# Patient Record
Sex: Male | Born: 1950 | Race: Black or African American | Hispanic: No | Marital: Married | Smoking: Former smoker
Health system: Southern US, Community
[De-identification: ages and names within clinical notes are randomized; demographics above are authoritative.]

## PROBLEM LIST (undated history)

## (undated) DIAGNOSIS — Z9289 Personal history of other medical treatment: Secondary | ICD-10-CM

## (undated) DIAGNOSIS — I10 Essential (primary) hypertension: Secondary | ICD-10-CM

## (undated) DIAGNOSIS — F1010 Alcohol abuse, uncomplicated: Secondary | ICD-10-CM

## (undated) DIAGNOSIS — G4733 Obstructive sleep apnea (adult) (pediatric): Secondary | ICD-10-CM

## (undated) DIAGNOSIS — E785 Hyperlipidemia, unspecified: Secondary | ICD-10-CM

## (undated) DIAGNOSIS — I4892 Unspecified atrial flutter: Secondary | ICD-10-CM

## (undated) HISTORY — DX: Unspecified atrial flutter: I48.92

## (undated) HISTORY — PX: HERNIA REPAIR: SHX51

## (undated) HISTORY — DX: Alcohol abuse, uncomplicated: F10.10

## (undated) HISTORY — DX: Obstructive sleep apnea (adult) (pediatric): G47.33

## (undated) HISTORY — DX: Personal history of other medical treatment: Z92.89

---

## 2007-09-24 ENCOUNTER — Other Ambulatory Visit: Payer: Self-pay

## 2007-09-24 ENCOUNTER — Ambulatory Visit: Payer: Self-pay | Admitting: General Surgery

## 2007-10-01 ENCOUNTER — Other Ambulatory Visit: Payer: Self-pay

## 2007-10-01 ENCOUNTER — Ambulatory Visit: Payer: Self-pay | Admitting: General Surgery

## 2011-10-11 ENCOUNTER — Emergency Department: Payer: Self-pay | Admitting: *Deleted

## 2011-10-11 LAB — COMPREHENSIVE METABOLIC PANEL
Alkaline Phosphatase: 65 U/L (ref 50–136)
Anion Gap: 10 (ref 7–16)
BUN: 11 mg/dL (ref 7–18)
Bilirubin,Total: 0.2 mg/dL (ref 0.2–1.0)
Calcium, Total: 8.7 mg/dL (ref 8.5–10.1)
Co2: 25 mmol/L (ref 21–32)
EGFR (African American): >60
EGFR (Non-African Amer.): >60
Potassium: 3.8 mmol/L (ref 3.5–5.1)
SGOT(AST): 23 U/L (ref 15–37)

## 2011-10-11 LAB — CBC
HGB: 14.1 g/dL (ref 13.0–18.0)
MCH: 27.2 pg (ref 26.0–34.0)
MCV: 82 fL (ref 80–100)
WBC: 11.1 10*3/uL — ABNORMAL HIGH (ref 3.8–10.6)

## 2017-11-24 DIAGNOSIS — I10 Essential (primary) hypertension: Secondary | ICD-10-CM | POA: Diagnosis not present

## 2017-11-24 DIAGNOSIS — G4733 Obstructive sleep apnea (adult) (pediatric): Secondary | ICD-10-CM | POA: Diagnosis not present

## 2017-11-24 DIAGNOSIS — E6609 Other obesity due to excess calories: Secondary | ICD-10-CM | POA: Diagnosis not present

## 2017-11-24 DIAGNOSIS — E781 Pure hyperglyceridemia: Secondary | ICD-10-CM | POA: Diagnosis not present

## 2017-11-24 DIAGNOSIS — N528 Other male erectile dysfunction: Secondary | ICD-10-CM | POA: Diagnosis not present

## 2017-11-24 DIAGNOSIS — R7303 Prediabetes: Secondary | ICD-10-CM | POA: Diagnosis not present

## 2017-12-01 DIAGNOSIS — E781 Pure hyperglyceridemia: Secondary | ICD-10-CM | POA: Diagnosis not present

## 2017-12-01 DIAGNOSIS — R7303 Prediabetes: Secondary | ICD-10-CM | POA: Diagnosis not present

## 2017-12-01 DIAGNOSIS — I10 Essential (primary) hypertension: Secondary | ICD-10-CM | POA: Diagnosis not present

## 2017-12-08 ENCOUNTER — Emergency Department
Admission: EM | Admit: 2017-12-08 | Discharge: 2017-12-08 | Disposition: A | Discharge status: Home / Self Care | Payer: Self-pay | Attending: Emergency Medicine | Admitting: Emergency Medicine

## 2017-12-08 ENCOUNTER — Encounter: Payer: Self-pay | Admitting: Emergency Medicine

## 2017-12-08 ENCOUNTER — Emergency Department: Payer: Self-pay

## 2017-12-08 DIAGNOSIS — Z79899 Other long term (current) drug therapy: Secondary | ICD-10-CM | POA: Insufficient documentation

## 2017-12-08 DIAGNOSIS — Y999 Unspecified external cause status: Secondary | ICD-10-CM | POA: Insufficient documentation

## 2017-12-08 DIAGNOSIS — W231XXA Caught, crushed, jammed, or pinched between stationary objects, initial encounter: Secondary | ICD-10-CM | POA: Insufficient documentation

## 2017-12-08 DIAGNOSIS — I10 Essential (primary) hypertension: Secondary | ICD-10-CM | POA: Insufficient documentation

## 2017-12-08 DIAGNOSIS — T148XXA Other injury of unspecified body region, initial encounter: Secondary | ICD-10-CM

## 2017-12-08 DIAGNOSIS — S61012A Laceration without foreign body of left thumb without damage to nail, initial encounter: Secondary | ICD-10-CM | POA: Diagnosis not present

## 2017-12-08 DIAGNOSIS — S6702XA Crushing injury of left thumb, initial encounter: Secondary | ICD-10-CM | POA: Diagnosis not present

## 2017-12-08 DIAGNOSIS — S61112A Laceration without foreign body of left thumb with damage to nail, initial encounter: Secondary | ICD-10-CM | POA: Diagnosis not present

## 2017-12-08 DIAGNOSIS — Y929 Unspecified place or not applicable: Secondary | ICD-10-CM | POA: Insufficient documentation

## 2017-12-08 DIAGNOSIS — Y9389 Activity, other specified: Secondary | ICD-10-CM | POA: Insufficient documentation

## 2017-12-08 HISTORY — DX: Essential (primary) hypertension: I10

## 2017-12-08 MED ORDER — LIDOCAINE HCL (PF) 1 % IJ SOLN
5.0000 mL | Freq: Once | INTRAMUSCULAR | Status: AC
  Administered 2017-12-08: 5 mL via INTRADERMAL
  Filled 2017-12-08: qty 5

## 2017-12-08 MED ORDER — TRAMADOL HCL 50 MG PO TABS: 50.0000 mg | ORAL_TABLET | Freq: Four times a day (QID) | ORAL | 0 refills | Status: DC | PRN

## 2017-12-08 MED ORDER — CEPHALEXIN 500 MG PO CAPS: 500.0000 mg | ORAL_CAPSULE | Freq: Three times a day (TID) | ORAL | 0 refills | Status: DC

## 2017-12-08 MED ORDER — BACITRACIN ZINC 500 UNIT/GM EX OINT
1.0000 "application " | TOPICAL_OINTMENT | Freq: Once | CUTANEOUS | Status: DC
  Filled 2017-12-08: qty 0.9

## 2017-12-08 NOTE — Discharge Instructions (Addendum)
Follow-up with your regular doctor or have the nurse at [facility] [employer] fabrics remove the stitches.  Stitches should remain in place for 7 to 10 days.  You been given an antibiotic to prevent infection as this was a very open wound.  You do not have a broken bone underneath.  However if you lose any range of motion of your left hand and thumb but you will need to see orthopedic doctor.  You should remain on light duty for 5 to 7 days.  No use of the left hand.

## 2017-12-08 NOTE — ED Notes (Signed)
This tech wrapped pt left thumb with roll gauze and tape with a bulky wrap

## 2017-12-08 NOTE — ED Provider Notes (Signed)
[facility] Emergency Department Provider Note  ____________________________________________   First MD Initiated Contact with Patient 12/08/17 [phone]     (approximate)  I have reviewed the triage vital signs and the nursing notes.   HISTORY  Chief Complaint Laceration and Finger Injury    HPI Peter Hartman is a 67 y.o. male since emergency department complaining of laceration to the left thumb.  He states he was working on a car and the jack fell crushing his left thumb.  He states his last tetanus was less than 5 years ago.  He states he does not think he hit the bone just hit the skin.  He denies any numbness or tingling at this time.  Past Medical History:  Diagnosis Date  . Hypertension     There are no active problems to display for this patient.   History reviewed. No pertinent surgical history.  Prior to Admission medications   Medication Sig Start Date End Date Taking? Authorizing Provider  hydrochlorothiazide (HYDRODIURIL) 25 MG tablet Take 25 mg by mouth daily.   Yes [provider]  lisinopril (PRINIVIL,ZESTRIL) 20 MG tablet Take 20 mg by mouth daily.   Yes [provider]  cephALEXin (KEFLEX) 500 MG capsule Take 1 capsule (500 mg total) by mouth 3 (three) times daily. 12/08/17   Hisae Decoursey, Roselyn Bering, PA-C  traMADol (ULTRAM) 50 MG tablet Take 1 tablet (50 mg total) by mouth every 6 (six) hours as needed. 12/08/17   Faythe Ghee, PA-C    Allergies Patient has no allergy information on record.  No family history on file.  Social History Social History   Tobacco Use  . Smoking status: Not on file  Substance Use Topics  . Alcohol use: Not on file  . Drug use: Not on file    Review of Systems  Constitutional: No fever/chills Eyes: No visual changes. ENT: No sore throat. Respiratory: Denies cough Genitourinary: Negative for dysuria. Musculoskeletal: Negative for back pain.  Positive for thumb injury Skin:  Negative for rash.  Positive for laceration left thumb    ____________________________________________   PHYSICAL EXAM:  VITAL SIGNS: ED Triage Vitals  Enc Vitals Group     BP 12/08/17 0824 (!) 168/85     Pulse Rate 12/08/17 0824 82     Resp 12/08/17 0824 15     Temp 12/08/17 0824 98.6 F (37 C)     Temp Source 12/08/17 0824 Oral     SpO2 12/08/17 0824 99 %     Weight 12/08/17 0813 243 lb (110.2 kg)     Height 12/08/17 0813 5\' 9"  (1.753 m)     Head Circumference --      Peak Flow --      Pain Score 12/08/17 0813 6     Pain Loc --      Pain Edu? --      Excl. in GC? --     Constitutional: Alert and oriented. Well appearing and in no acute distress. Eyes: Conjunctivae are normal.  Head: Atraumatic. Nose: No congestion/rhinnorhea. Mouth/Throat: Mucous membranes are moist.   Cardiovascular: Normal rate, regular rhythm. Respiratory: Normal respiratory effort.  No retractions GU: deferred Musculoskeletal: FROM all extremities, warm and well perfused.  There is a large laceration along the outside of the left thumb.  It extends from the base of the thumb to the tip of the finger.  There is a small star-shaped laceration on the pad of the thumb.  He has full  range of motion and is neurovascularly intact Neurologic:  Normal speech and language.  Skin:  Skin is warm, dry.  Positive laceration to left thumb.  No rash noted. Psychiatric: Mood and affect are normal. Speech and behavior are normal.  ____________________________________________   LABS (all labs ordered are listed, but only abnormal results are displayed)  Labs Reviewed - No data to display ____________________________________________   ____________________________________________  RADIOLOGY  X-ray of the left thumb is negative for fracture  ____________________________________________   PROCEDURES  Procedure(s) performed:   Marland KitchenMarland KitchenLaceration Repair Date/Time: 12/08/2017 2:52 PM Performed by: Faythe Ghee, PA-C Authorized by: Faythe Ghee, PA-C   Consent:    Consent obtained:  Verbal   Consent given by:  Patient   Risks discussed:  Pain, poor cosmetic result, poor wound healing, infection and tendon damage   Alternatives discussed:  Delayed treatment Anesthesia (see MAR for exact dosages):    Anesthesia method:  Local infiltration   Local anesthetic:  Lidocaine 1% w/o epi Laceration details:    Location:  Finger   Finger location:  L thumb   Length (cm):  3.5   Depth (mm):  5 Repair type:    Repair type:  Simple Pre-procedure details:    Preparation:  Patient was prepped and draped in usual sterile fashion Exploration:    Hemostasis achieved with:  Direct pressure   Wound exploration: wound explored through full range of motion     Wound extent: fascia violated     Wound extent: no foreign bodies/material noted, no muscle damage noted, no nerve damage noted, no tendon damage noted, no underlying fracture noted and no vascular damage noted     Contaminated: no   Treatment:    Area cleansed with:  Betadine and saline   Amount of cleaning:  Extensive   Irrigation solution:  Sterile saline   Irrigation method:  Pressure wash, syringe and tap   Visualized foreign bodies/material removed: no   Skin repair:    Repair method:  Sutures   Suture size:  5-0   Suture material:  Nylon   Suture technique:  Simple interrupted   Number of sutures:  14 Approximation:    Approximation:  Close Post-procedure details:    Dressing:  Antibiotic ointment and non-adherent dressing   Patient tolerance of procedure:  Tolerated well, no immediate complications      ____________________________________________   INITIAL IMPRESSION / ASSESSMENT AND PLAN / ED COURSE  Pertinent labs & imaging results that were available during my care of the patient were reviewed by me and considered in my medical decision making (see chart for details).  Patient is a 67 year old male presents emergency  department with a large laceration to the left thumb.  His crush injury where the jack fell and landed on his thumb.  On physical exam the thumb has a laceration from the base of the thumb to the tip of the thumb on the side, a star-shaped lack on the pad of the thumb.  The bone is mildly tender.  He is neurovascularly intact.  No tendon involvement is noted  X-ray left thumb is negative  The laceration was repaired with 5-0 Ethilon, 14 sutures were placed.  The patient was given a prescription for Keflex 500 mg 3 times a day for 7 days.  He is to have the sutures removed in 7 to 10 days.  He was given a work note for light duty for the next 5 to 7 days.  He should  not use his left hand while at work.  He states he understands and will comply with our instructions.  He was discharged in stable condition     As part of my medical decision making, I reviewed the following data within the electronic MEDICAL RECORD NUMBER Nursing notes reviewed and incorporated, Old chart reviewed, Radiograph reviewed x-ray left thumb is negative, Notes from prior ED visits and [place] Controlled Substance Database  ____________________________________________   FINAL CLINICAL IMPRESSION(S) / ED DIAGNOSES  Final diagnoses:  Laceration of left thumb without foreign body without damage to nail, initial encounter  Crush injury      NEW MEDICATIONS STARTED DURING THIS VISIT:  Discharge Medication List as of 12/08/2017 10:55 AM    START taking these medications   Details  cephALEXin (KEFLEX) 500 MG capsule Take 1 capsule (500 mg total) by mouth 3 (three) times daily., Starting Tue 12/08/2017, Print    traMADol (ULTRAM) 50 MG tablet Take 1 tablet (50 mg total) by mouth every 6 (six) hours as needed., Starting Tue 12/08/2017, Print         Note:  This document was prepared using Dragon voice recognition software and may include unintentional dictation errors.    Faythe Ghee, PA-C 12/08/17 1457    Nita Sickle, MD 12/09/17 1255

## 2017-12-08 NOTE — ED Notes (Signed)
See triage note  States a jack slipped   Hit the edge of left thumb  Laceration to lateral aspect of thumb

## 2017-12-08 NOTE — ED Triage Notes (Signed)
Pt reports was messing with a jack and it fell on his left hand thumb. Pt with laceration to thumb. Area wrapped in bandage.

## 2018-03-22 DIAGNOSIS — G4733 Obstructive sleep apnea (adult) (pediatric): Secondary | ICD-10-CM | POA: Diagnosis not present

## 2018-03-22 DIAGNOSIS — I10 Essential (primary) hypertension: Secondary | ICD-10-CM | POA: Diagnosis not present

## 2018-03-22 DIAGNOSIS — R7303 Prediabetes: Secondary | ICD-10-CM | POA: Diagnosis not present

## 2018-03-22 DIAGNOSIS — E781 Pure hyperglyceridemia: Secondary | ICD-10-CM | POA: Diagnosis not present

## 2018-07-23 ENCOUNTER — Ambulatory Visit: Payer: PRIVATE HEALTH INSURANCE | Attending: Neurology

## 2018-07-23 DIAGNOSIS — G4733 Obstructive sleep apnea (adult) (pediatric): Secondary | ICD-10-CM | POA: Diagnosis not present

## 2018-07-28 ENCOUNTER — Ambulatory Visit (INDEPENDENT_AMBULATORY_CARE_PROVIDER_SITE_OTHER): Payer: No Typology Code available for payment source | Admitting: Urology

## 2018-07-28 ENCOUNTER — Encounter: Payer: Self-pay | Admitting: Urology

## 2018-07-28 VITALS — BP 155/91 | HR 91 | Ht 69.0 in | Wt 248.9 lb

## 2018-07-28 DIAGNOSIS — N5201 Erectile dysfunction due to arterial insufficiency: Secondary | ICD-10-CM

## 2018-07-28 MED ORDER — SILDENAFIL CITRATE 50 MG PO TABS: 50.0000 mg | ORAL_TABLET | Freq: Every day | ORAL | 6 refills | Status: DC | PRN

## 2018-07-28 NOTE — Progress Notes (Signed)
07/28/2018 10:31 AM   Peter Hartman 1950-11-23 [id number]  Referring provider: Marisue Ivan, MD [phone] [facility] [address] [place] [facility] [place], [place] [address]  Chief Complaint  Patient presents with  . Erectile Dysfunction    HPI:  New patient seen today for erectile dysfunction. He has trouble getting and keeping an erection. It's been an issue for 15 yrs and worse over past 3-4 months. Erections straight. He remarried last year. He hasn't tried anything recently. He has a good libido. He has three grown boys.   He had a PSA in 2018 of 0.87.  His erectile dysfunction is associated with obesity, hypertension, hyperlipidemia and diabetes mellitus.  Modifying factors: There are no other modifying factors  Associated signs and symptoms: There are no other associated signs and symptoms Aggravating and relieving factors: There are no other aggravating or relieving factors Severity: Moderate Duration: Persistent   PMH: Past Medical History:  Diagnosis Date  . Hypertension     Surgical History: Past Surgical History:  Procedure Laterality Date  . HERNIA REPAIR      Home Medications:  Allergies as of 07/28/2018   No Known Allergies     Medication List       Accurate as of July 28, 2018 10:31 AM. Always use your most recent med list.        fenofibrate micronized 200 MG capsule Commonly known as:  LOFIBRA Take 200 mg by mouth daily.   hydrochlorothiazide 25 MG tablet Commonly known as:  HYDRODIURIL Take 25 mg by mouth daily.   lisinopril 20 MG tablet Commonly known as:  PRINIVIL,ZESTRIL Take 20 mg by mouth daily.       Allergies: No Known Allergies  Family History: Family History  Problem Relation Age of Onset  . Prostate cancer Neg Hx   . Bladder Cancer Neg Hx   . Kidney cancer Neg Hx     Social History:  reports that he quit smoking about 2 years ago. His smoking use included cigarettes. He has never used smokeless tobacco. He  reports current alcohol use. He reports that he does not use drugs.  ROS: UROLOGY Frequent Urination?: No Hard to postpone urination?: No Burning/pain with urination?: No Get up at night to urinate?: No Leakage of urine?: No Urine stream starts and stops?: No Trouble starting stream?: No Do you have to strain to urinate?: No Blood in urine?: No Urinary tract infection?: No Sexually transmitted disease?: No Injury to kidneys or bladder?: No Painful intercourse?: No Weak stream?: No Erection problems?: Yes Penile pain?: No  Gastrointestinal Nausea?: No Vomiting?: No Indigestion/heartburn?: No Diarrhea?: No Constipation?: No  Constitutional Fever: No Night sweats?: No Weight loss?: No Fatigue?: No  Skin Skin rash/lesions?: No Itching?: No  Eyes Blurred vision?: No Double vision?: No  Ears/Nose/Throat Sore throat?: No Sinus problems?: No  Hematologic/Lymphatic Swollen glands?: No Easy bruising?: No  Cardiovascular Leg swelling?: No Chest pain?: No  Respiratory Cough?: Yes Shortness of breath?: Yes  Endocrine Excessive thirst?: No  Musculoskeletal Back pain?: No Joint pain?: No  Neurological Headaches?: No Dizziness?: No  Psychologic Depression?: No Anxiety?: Yes  Physical Exam: BP (!) 155/91 (BP Location: Left Arm, Patient Position: Sitting, Cuff Size: Normal)   Pulse 91   Ht 5\' 9"  (1.753 m)   Wt 112.9 kg   BMI 36.76 kg/m   Constitutional:  Alert and oriented, No acute distress. HEENT: [employer] AT, moist mucus membranes.  Trachea midline, no masses. Cardiovascular: No clubbing, cyanosis, or edema. Respiratory:  Normal respiratory effort, no increased work of breathing. GI: Abdomen is soft, nontender, nondistended, no abdominal masses GU: No CVA tenderness; penis uncircumcised, no phimosis. Foreskin and penis normal.  DRE: 30 g, smooth, no hard area or nodule on prostate  Lymph: No cervical or inguinal lymphadenopathy. Skin: No rashes,  bruises or suspicious lesions. Neurologic: Grossly intact, no focal deficits, moving all 4 extremities. Psychiatric: Normal mood and affect.  Laboratory Data: Lab Results  Component Value Date   WBC 11.1 (H) 10/11/2011   HGB 14.1 10/11/2011   HCT 42.4 10/11/2011   MCV 82 10/11/2011   PLT 252 10/11/2011    Lab Results  Component Value Date   CREATININE 1.07 10/11/2011    No results found for: PSA  No results found for: TESTOSTERONE  No results found for: HGBA1C  Urinalysis No results found for: COLORURINE, APPEARANCEUR, LABSPEC, PHURINE, GLUCOSEU, HGBUR, BILIRUBINUR, KETONESUR, PROTEINUR, UROBILINOGEN, NITRITE, LEUKOCYTESUR  No results found for: LABMICR, WBCUA, RBCUA, LABEPIT, MUCUS, BACTERIA   No results found for this or any previous visit. No results found for this or any previous visit. No results found for this or any previous visit. No results found for this or any previous visit. No results found for this or any previous visit. No results found for this or any previous visit. No results found for this or any previous visit. No results found for this or any previous visit.  Assessment & Plan:     ED -  We discussed the nature r/b/a to pde5i. I sent a rx for sildenafil 50 mg. Also discussed injections. I recommended a PSA, but he said he gets yearly labs soon with Dr. Burnadette Pop and they will check it.   No follow-ups on file.  Jerilee Field, MD  [facility] [address], [address] [place], [place] [address] [phone]

## 2018-07-28 NOTE — Patient Instructions (Signed)
Sildenafil tablets (Erectile Dysfunction)  What is this medicine?  SILDENAFIL (sil DEN a fil) is used to treat erection problems in men.  This medicine may be used for other purposes; ask your health care provider or pharmacist if you have questions.  COMMON BRAND NAME(S): Viagra  What should I tell my health care provider before I take this medicine?  They need to know if you have any of these conditions:  -bleeding disorders  -eye or vision problems, including a rare inherited eye disease called retinitis pigmentosa  -anatomical deformation of the penis, Peyronie's disease, or history of priapism (painful and prolonged erection)  -heart disease, angina, a history of heart attack, irregular heart beats, or other heart problems  -high or low blood pressure  -history of blood diseases, like sickle cell anemia or leukemia  -history of stomach bleeding  -kidney disease  -liver disease  -stroke  -an unusual or allergic reaction to sildenafil, other medicines, foods, dyes, or preservatives  -pregnant or trying to get pregnant  -breast-feeding  How should I use this medicine?  Take this medicine by mouth with a glass of water. Follow the directions on the prescription label. The dose is usually taken 1 hour before sexual activity. You should not take the dose more than once per day. Do not take your medicine more often than directed.  Talk to your pediatrician regarding the use of this medicine in children. This medicine is not used in children for this condition.  Overdosage: If you think you have taken too much of this medicine contact a poison control center or emergency room at once.  NOTE: This medicine is only for you. Do not share this medicine with others.  What if I miss a dose?  This does not apply. Do not take double or extra doses.  What may interact with this medicine?  Do not take this medicine with any of the following medications:  -cisapride  -nitrates like amyl nitrite, isosorbide dinitrate, isosorbide  mononitrate, nitroglycerin  -riociguat  This medicine may also interact with the following medications:  -antiviral medicines for HIV or AIDS  -bosentan  -certain medicines for benign prostatic hyperplasia (BPH)  -certain medicines for blood pressure  -certain medicines for fungal infections like ketoconazole and itraconazole  -cimetidine  -erythromycin  -rifampin  This list may not describe all possible interactions. Give your health care provider a list of all the medicines, herbs, non-prescription drugs, or dietary supplements you use. Also tell them if you smoke, drink alcohol, or use illegal drugs. Some items may interact with your medicine.  What should I watch for while using this medicine?  If you notice any changes in your vision while taking this drug, call your doctor or health care professional as soon as possible. Stop using this medicine and call your health care provider right away if you have a loss of sight in one or both eyes.  Contact your doctor or health care professional right away if you have an erection that lasts longer than 4 hours or if it becomes painful. This may be a sign of a serious problem and must be treated right away to prevent permanent damage.  If you experience symptoms of nausea, dizziness, chest pain or arm pain upon initiation of sexual activity after taking this medicine, you should refrain from further activity and call your doctor or health care professional as soon as possible.  Do not drink alcohol to excess (examples, 5 glasses of wine or 5 shots of   whiskey) when taking this medicine. When taken in excess, alcohol can increase your chances of getting a headache or getting dizzy, increasing your heart rate or lowering your blood pressure.  Using this medicine does not protect you or your partner against HIV infection (the virus that causes AIDS) or other sexually transmitted diseases.  What side effects may I notice from receiving this medicine?  Side effects that you  should report to your doctor or health care professional as soon as possible:  -allergic reactions like skin rash, itching or hives, swelling of the face, lips, or tongue  -breathing problems  -changes in hearing  -changes in vision  -chest pain  -fast, irregular heartbeat  -prolonged or painful erection  -seizures  Side effects that usually do not require medical attention (report to your doctor or health care professional if they continue or are bothersome):  -back pain  -dizziness  -flushing  -headache  -indigestion  -muscle aches  -nausea  -stuffy or runny nose  This list may not describe all possible side effects. Call your doctor for medical advice about side effects. You may report side effects to FDA at 1-800-FDA-1088.  Where should I keep my medicine?  Keep out of reach of children.  Store at room temperature between 15 and 30 degrees C (59 and 86 degrees F). Throw away any unused medicine after the expiration date.  NOTE: This sheet is a summary. It may not cover all possible information. If you have questions about this medicine, talk to your doctor, pharmacist, or health care provider.   2019 Elsevier/Gold Standard (2015-05-30 12:00:25)    Erectile Dysfunction  Erectile dysfunction (ED) is the inability to get or keep an erection in order to have sexual intercourse. Erectile dysfunction may include:   Inability to get an erection.   Lack of enough hardness of the erection to allow penetration.   Loss of the erection before sex is finished.  What are the causes?  This condition may be caused by:   Certain medicines, such as:  ? Pain relievers.  ? Antihistamines.  ? Antidepressants.  ? Blood pressure medicines.  ? Water pills (diuretics).  ? Ulcer medicines.  ? Muscle relaxants.  ? Drugs.   Excessive drinking.   Psychological causes, such as:  ? Anxiety.  ? Depression.  ? Sadness.  ? Exhaustion.  ? Performance fear.  ? Stress.   Physical causes, such as:  ? Artery problems. This may include  diabetes, smoking, liver disease, or atherosclerosis.  ? High blood pressure.  ? Hormonal problems, such as low testosterone.  ? Obesity.  ? Nerve problems. This may include back or pelvic injuries, diabetes mellitus, multiple sclerosis, or Parkinson disease.  What are the signs or symptoms?  Symptoms of this condition include:   Inability to get an erection.   Lack of enough hardness of the erection to allow penetration.   Loss of the erection before sex is finished.   Normal erections at some times, but with frequent unsatisfactory episodes.   Low sexual satisfaction in either partner due to erection problems.   A curved penis occurring with erection. The curve may cause pain or the penis may be too curved to allow for intercourse.   Never having nighttime erections.  How is this diagnosed?  This condition is often diagnosed by:   Performing a physical exam to find other diseases or specific problems with the penis.   Asking you detailed questions about the problem.   Performing blood   tests to check for diabetes mellitus or to measure hormone levels.   Performing other tests to check for underlying health conditions.   Performing an ultrasound exam to check for scarring.   Performing a test to check blood flow to the penis.   Doing a sleep study at home to measure nighttime erections.  How is this treated?  This condition may be treated by:   Medicine taken by mouth to help you achieve an erection (oral medicine).   Hormone replacement therapy to replace low testosterone levels.   Medicine that is injected into the penis. Your health care provider may instruct you how to give yourself these injections at home.   Vacuum pump. This is a pump with a ring on it. The pump and ring are placed on the penis and used to create pressure that helps the penis become erect.   Penile implant surgery. In this procedure, you may receive:  ? An inflatable implant. This consists of cylinders, a pump, and a  reservoir. The cylinders can be inflated with a fluid that helps to create an erection, and they can be deflated after intercourse.  ? A semi-rigid implant. This consists of two silicone rubber rods. The rods provide some rigidity. They are also flexible, so the penis can both curve downward in its normal position and become straight for sexual intercourse.   Blood vessel surgery, to improve blood flow to the penis. During this procedure, a blood vessel from a different part of the body is placed into the penis to allow blood to flow around (bypass) damaged or blocked blood vessels.   Lifestyle changes, such as exercising more, losing weight, and quitting smoking.  Follow these instructions at home:  Medicines     Take over-the-counter and prescription medicines only as told by your health care provider. Do not increase the dosage without first discussing it with your health care provider.   If you are using self-injections, perform injections as directed by your health care provider. Make sure to avoid any veins that are on the surface of the penis. After giving an injection, apply pressure to the injection site for 5 minutes.  General instructions   Exercise regularly, as directed by your health care provider. Work with your health care provider to lose weight, if needed.   Do not use any products that contain nicotine or tobacco, such as cigarettes and e-cigarettes. If you need help quitting, ask your health care provider.   Before using a vacuum pump, read the instructions that come with the pump and discuss any questions with your health care provider.   Keep all follow-up visits as told by your health care provider. This is important.  Contact a health care provider if:   You feel nauseous.   You vomit.  Get help right away if:   You are taking oral or injectable medicines and you have an erection that lasts longer than 4 hours. If your health care provider is unavailable, go to the nearest  emergency room for evaluation. An erection that lasts much longer than 4 hours can result in permanent damage to your penis.   You have severe pain in your groin or abdomen.   You develop redness or severe swelling of your penis.   You have redness spreading up into your groin or lower abdomen.   You are unable to urinate.   You experience chest pain or a rapid heart beat (palpitations) after taking oral medicines.  Summary     Erectile dysfunction (ED) is the inability to get or keep an erection during sexual intercourse. This problem can usually be treated successfully.   This condition is diagnosed based on a physical exam, your symptoms, and tests to determine the cause. Treatment varies depending on the cause, and may include medicines, hormone therapy, surgery, or vacuum pump.   You may need follow-up visits to make sure that you are using your medicines or devices correctly.   Get help right away if you are taking or injecting medicines and you have an erection that lasts longer than 4 hours.  This information is not intended to replace advice given to you by your health care provider. Make sure you discuss any questions you have with your health care provider.  Document Released: 06/13/2000 Document Revised: 07/02/2016 Document Reviewed: 07/02/2016  Elsevier Interactive Patient Education  2019 Elsevier Inc.

## 2018-07-29 DIAGNOSIS — Z0000 Encounter for general adult medical examination without abnormal findings: Secondary | ICD-10-CM | POA: Diagnosis not present

## 2018-07-29 DIAGNOSIS — R7303 Prediabetes: Secondary | ICD-10-CM | POA: Diagnosis not present

## 2018-07-29 DIAGNOSIS — E781 Pure hyperglyceridemia: Secondary | ICD-10-CM | POA: Diagnosis not present

## 2018-07-29 DIAGNOSIS — I10 Essential (primary) hypertension: Secondary | ICD-10-CM | POA: Diagnosis not present

## 2018-10-27 ENCOUNTER — Ambulatory Visit: Payer: Medicare HMO | Admitting: Urology

## 2018-12-08 ENCOUNTER — Ambulatory Visit: Payer: Medicare HMO | Admitting: Urology

## 2018-12-08 ENCOUNTER — Encounter: Payer: Self-pay | Admitting: Urology

## 2019-01-26 ENCOUNTER — Encounter: Payer: Self-pay | Admitting: Urology

## 2019-01-26 ENCOUNTER — Ambulatory Visit (INDEPENDENT_AMBULATORY_CARE_PROVIDER_SITE_OTHER): Payer: No Typology Code available for payment source | Admitting: Urology

## 2019-01-26 ENCOUNTER — Other Ambulatory Visit: Payer: Self-pay

## 2019-01-26 VITALS — BP 159/90 | HR 75 | Ht 69.0 in | Wt 250.0 lb

## 2019-01-26 DIAGNOSIS — E781 Pure hyperglyceridemia: Secondary | ICD-10-CM | POA: Diagnosis not present

## 2019-01-26 DIAGNOSIS — N5201 Erectile dysfunction due to arterial insufficiency: Secondary | ICD-10-CM

## 2019-01-26 DIAGNOSIS — R7303 Prediabetes: Secondary | ICD-10-CM | POA: Diagnosis not present

## 2019-01-26 DIAGNOSIS — I10 Essential (primary) hypertension: Secondary | ICD-10-CM | POA: Diagnosis not present

## 2019-01-26 DIAGNOSIS — Z862 Personal history of diseases of the blood and blood-forming organs and certain disorders involving the immune mechanism: Secondary | ICD-10-CM | POA: Diagnosis not present

## 2019-01-26 MED ORDER — SILDENAFIL CITRATE 20 MG PO TABS: ORAL_TABLET | ORAL | 11 refills | Status: DC

## 2019-01-26 NOTE — Patient Instructions (Signed)
Erectile Dysfunction Erectile dysfunction (ED) is the inability to get or keep an erection in order to have sexual intercourse. Erectile dysfunction may include:  Inability to get an erection.  Lack of enough hardness of the erection to allow penetration.  Loss of the erection before sex is finished. What are the causes? This condition may be caused by:  Certain medicines, such as: ? Pain relievers. ? Antihistamines. ? Antidepressants. ? Blood pressure medicines. ? Water pills (diuretics). ? Ulcer medicines. ? Muscle relaxants. ? Drugs.  Excessive drinking.  Psychological causes, such as: ? Anxiety. ? Depression. ? Sadness. ? Exhaustion. ? Performance fear. ? Stress.  Physical causes, such as: ? Artery problems. This may include diabetes, smoking, liver disease, or atherosclerosis. ? High blood pressure. ? Hormonal problems, such as low testosterone. ? Obesity. ? Nerve problems. This may include back or pelvic injuries, diabetes mellitus, multiple sclerosis, or Parkinson disease. What are the signs or symptoms? Symptoms of this condition include:  Inability to get an erection.  Lack of enough hardness of the erection to allow penetration.  Loss of the erection before sex is finished.  Normal erections at some times, but with frequent unsatisfactory episodes.  Low sexual satisfaction in either partner due to erection problems.  A curved penis occurring with erection. The curve may cause pain or the penis may be too curved to allow for intercourse.  Never having nighttime erections. How is this diagnosed? This condition is often diagnosed by:  Performing a physical exam to find other diseases or specific problems with the penis.  Asking you detailed questions about the problem.  Performing blood tests to check for diabetes mellitus or to measure hormone levels.  Performing other tests to check for underlying health conditions.  Performing an ultrasound  exam to check for scarring.  Performing a test to check blood flow to the penis.  Doing a sleep study at home to measure nighttime erections. How is this treated? This condition may be treated by:  Medicine taken by mouth to help you achieve an erection (oral medicine).  Hormone replacement therapy to replace low testosterone levels.  Medicine that is injected into the penis. Your health care provider may instruct you how to give yourself these injections at home.  Vacuum pump. This is a pump with a ring on it. The pump and ring are placed on the penis and used to create pressure that helps the penis become erect.  Penile implant surgery. In this procedure, you may receive: ? An inflatable implant. This consists of cylinders, a pump, and a reservoir. The cylinders can be inflated with a fluid that helps to create an erection, and they can be deflated after intercourse. ? A semi-rigid implant. This consists of two silicone rubber rods. The rods provide some rigidity. They are also flexible, so the penis can both curve downward in its normal position and become straight for sexual intercourse.  Blood vessel surgery, to improve blood flow to the penis. During this procedure, a blood vessel from a different part of the body is placed into the penis to allow blood to flow around (bypass) damaged or blocked blood vessels.  Lifestyle changes, such as exercising more, losing weight, and quitting smoking. Follow these instructions at home: Medicines   Take over-the-counter and prescription medicines only as told by your health care provider. Do not increase the dosage without first discussing it with your health care provider.  If you are using self-injections, perform injections as directed by your   health care provider. Make sure to avoid any veins that are on the surface of the penis. After giving an injection, apply pressure to the injection site for 5 minutes. General instructions   Exercise regularly, as directed by your health care provider. Work with your health care provider to lose weight, if needed.  Do not use any products that contain nicotine or tobacco, such as cigarettes and e-cigarettes. If you need help quitting, ask your health care provider.  Before using a vacuum pump, read the instructions that come with the pump and discuss any questions with your health care provider.  Keep all follow-up visits as told by your health care provider. This is important. Contact a health care provider if:  You feel nauseous.  You vomit. Get help right away if:  You are taking oral or injectable medicines and you have an erection that lasts longer than 4 hours. If your health care provider is unavailable, go to the nearest emergency room for evaluation. An erection that lasts much longer than 4 hours can result in permanent damage to your penis.  You have severe pain in your groin or abdomen.  You develop redness or severe swelling of your penis.  You have redness spreading up into your groin or lower abdomen.  You are unable to urinate.  You experience chest pain or a rapid heart beat (palpitations) after taking oral medicines. Summary  Erectile dysfunction (ED) is the inability to get or keep an erection during sexual intercourse. This problem can usually be treated successfully.  This condition is diagnosed based on a physical exam, your symptoms, and tests to determine the cause. Treatment varies depending on the cause, and may include medicines, hormone therapy, surgery, or vacuum pump.  You may need follow-up visits to make sure that you are using your medicines or devices correctly.  Get help right away if you are taking or injecting medicines and you have an erection that lasts longer than 4 hours. This information is not intended to replace advice given to you by your health care provider. Make sure you discuss any questions you have with your health care  provider. Document Released: 06/13/2000 Document Revised: 05/29/2017 Document Reviewed: 07/02/2016 Elsevier Patient Education  2020 Elsevier Inc.  

## 2019-01-26 NOTE — Progress Notes (Signed)
01/26/2019 9:21 AM   Peter Hartman [birth year] [id number]  Referring provider: Dion Body, MD [facility] [place],  [place] [address]  Chief Complaint  Patient presents with  . Erectile Dysfunction    HPI:  F/u -- erectile dysfunction. He has trouble getting and keeping an erection. It's been an issue since about 2005 and worse in 2020. Erections straight. He remarried in 2019. He has a good libido. He has three grown boys. He had a PSA in 2018 of 0.87 and 0.64.  His erectile dysfunction is associated with obesity, hypertension, hyperlipidemia and diabetes mellitus.   He returns and tried sidenafil 50 mg. He still has issues getting and maintaining erection.   PMH: Past Medical History:  Diagnosis Date  . Hypertension     Surgical History: Past Surgical History:  Procedure Laterality Date  . HERNIA REPAIR      Home Medications:  Allergies as of 01/26/2019   No Known Allergies     Medication List       Accurate as of January 26, 2019  9:21 AM. If you have any questions, ask your nurse or doctor.        fenofibrate micronized 200 MG capsule Commonly known as: LOFIBRA Take 200 mg by mouth daily.   hydrochlorothiazide 25 MG tablet Commonly known as: HYDRODIURIL Take 25 mg by mouth daily.   lisinopril 20 MG tablet Commonly known as: ZESTRIL Take 20 mg by mouth daily.   sildenafil 50 MG tablet Commonly known as: VIAGRA Take 1 tablet (50 mg total) by mouth daily as needed for erectile dysfunction.       Allergies: No Known Allergies  Family History: Family History  Problem Relation Age of Onset  . Prostate cancer Neg Hx   . Bladder Cancer Neg Hx   . Kidney cancer Neg Hx     Social History:  reports that he quit smoking about 2 years ago. His smoking use included cigarettes. He has never used smokeless tobacco. He reports current alcohol use. He reports that he does not use drugs.  ROS:                                        Physical Exam: There were no vitals taken for this visit.  Constitutional:  Alert and oriented, No acute distress. HEENT: [place] AT, moist mucus membranes.  Trachea midline, no masses. Cardiovascular: No clubbing, cyanosis, or edema. Respiratory: Normal respiratory effort, no increased work of breathing. Skin: No rashes, bruises or suspicious lesions. Neurologic: Grossly intact, no focal deficits, moving all 4 extremities. Psychiatric: Normal mood and affect.  Laboratory Data: Lab Results  Component Value Date   WBC 11.1 (H) 10/11/2011   HGB 14.1 10/11/2011   HCT 42.4 10/11/2011   MCV 82 10/11/2011   PLT 252 10/11/2011    Lab Results  Component Value Date   CREATININE 1.07 10/11/2011    No results found for: PSA  No results found for: TESTOSTERONE  No results found for: HGBA1C  Urinalysis No results found for: COLORURINE, APPEARANCEUR, LABSPEC, PHURINE, GLUCOSEU, HGBUR, BILIRUBINUR, KETONESUR, PROTEINUR, UROBILINOGEN, NITRITE, LEUKOCYTESUR  No results found for: LABMICR, WBCUA, RBCUA, LABEPIT, MUCUS, BACTERIA  Pertinent Imaging: N/a. Reviewed blood work, PSA.  No results found for this or any previous visit. No results found for this or any previous visit. No results found for this or any previous  visit. No results found for this or any previous visit. No results found for this or any previous visit. No results found for this or any previous visit. No results found for this or any previous visit. No results found for this or any previous visit.  Assessment & Plan:    ED - his libido is good. We will increase sildenafil to 100 mg and then try tadalafil. We also discussed injections and the nature r/b of this approach.   No follow-ups on file.  Jerilee Field, MD  [facility] [address], [address] [place], [place] [address] [phone]

## 2019-02-03 DIAGNOSIS — I10 Essential (primary) hypertension: Secondary | ICD-10-CM | POA: Diagnosis not present

## 2019-02-03 DIAGNOSIS — E781 Pure hyperglyceridemia: Secondary | ICD-10-CM | POA: Diagnosis not present

## 2019-02-03 DIAGNOSIS — Z862 Personal history of diseases of the blood and blood-forming organs and certain disorders involving the immune mechanism: Secondary | ICD-10-CM | POA: Diagnosis not present

## 2019-03-11 IMAGING — DX DG FINGER THUMB 2+V*L*
3 series · 3 of 3 positions shown · non-contrast
Comparison: None.

CLINICAL DATA: Laceration to left thumb.

EXAM:
LEFT THUMB 2+V

[finger ap]
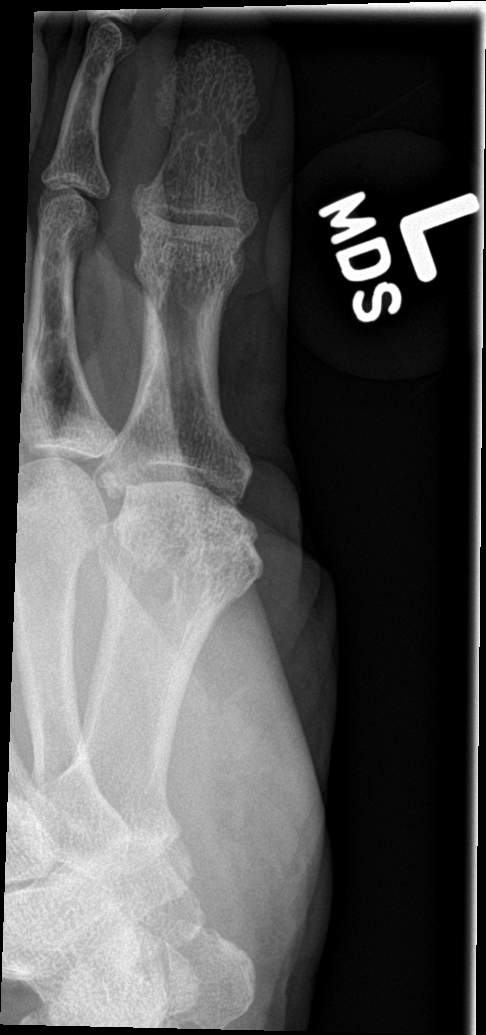

[finger obl]
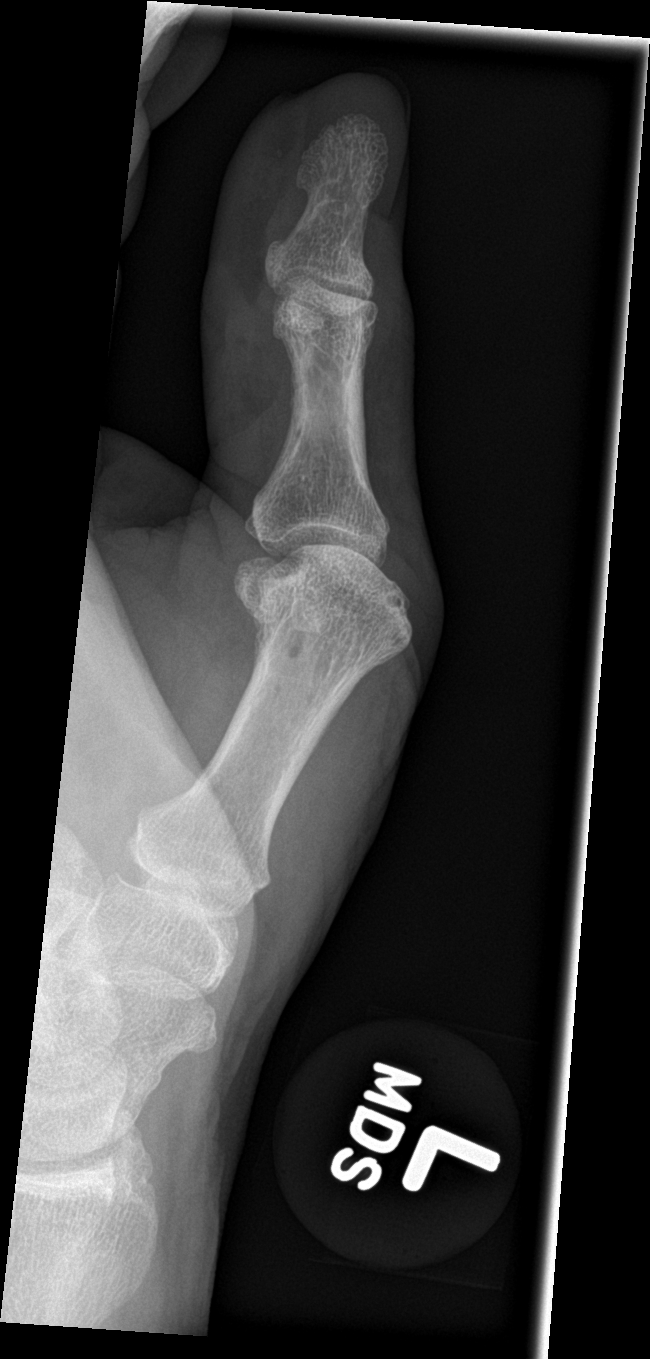

[finger lat]
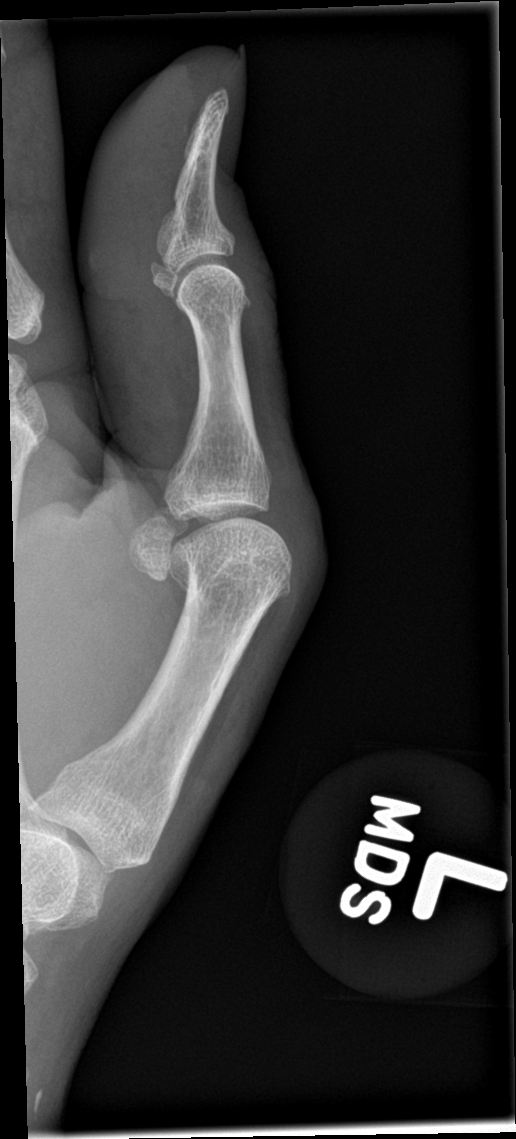

[3 of 3 positions shown; findings below may reference images not displayed]

FINDINGS: No acute bone abnormality identified. No fracture or dislocation.
Soft tissue laceration along the volar aspect of the thumb noted. No
radiopaque foreign bodies.
IMPRESSION: 1. No focal bone abnormality.
2. Soft tissue laceration.

## 2019-04-01 DIAGNOSIS — R42 Dizziness and giddiness: Secondary | ICD-10-CM | POA: Diagnosis not present

## 2019-04-01 DIAGNOSIS — I10 Essential (primary) hypertension: Secondary | ICD-10-CM | POA: Diagnosis not present

## 2019-07-22 ENCOUNTER — Encounter: Payer: Self-pay | Admitting: Urology

## 2019-07-22 ENCOUNTER — Ambulatory Visit: Payer: No Typology Code available for payment source | Admitting: Urology

## 2019-08-04 DIAGNOSIS — R0989 Other specified symptoms and signs involving the circulatory and respiratory systems: Secondary | ICD-10-CM | POA: Diagnosis not present

## 2019-08-04 DIAGNOSIS — R05 Cough: Secondary | ICD-10-CM | POA: Diagnosis not present

## 2019-08-04 DIAGNOSIS — Z03818 Encounter for observation for suspected exposure to other biological agents ruled out: Secondary | ICD-10-CM | POA: Diagnosis not present

## 2019-08-15 DIAGNOSIS — Z8616 Personal history of COVID-19: Secondary | ICD-10-CM | POA: Diagnosis not present

## 2019-08-22 DIAGNOSIS — Z8616 Personal history of COVID-19: Secondary | ICD-10-CM | POA: Diagnosis not present

## 2019-08-22 DIAGNOSIS — R0602 Shortness of breath: Secondary | ICD-10-CM | POA: Diagnosis not present

## 2019-10-07 DIAGNOSIS — Z125 Encounter for screening for malignant neoplasm of prostate: Secondary | ICD-10-CM | POA: Diagnosis not present

## 2019-10-07 DIAGNOSIS — Z131 Encounter for screening for diabetes mellitus: Secondary | ICD-10-CM | POA: Diagnosis not present

## 2019-10-07 DIAGNOSIS — E781 Pure hyperglyceridemia: Secondary | ICD-10-CM | POA: Diagnosis not present

## 2019-10-07 DIAGNOSIS — Z862 Personal history of diseases of the blood and blood-forming organs and certain disorders involving the immune mechanism: Secondary | ICD-10-CM | POA: Diagnosis not present

## 2019-10-07 DIAGNOSIS — I10 Essential (primary) hypertension: Secondary | ICD-10-CM | POA: Diagnosis not present

## 2019-10-17 DIAGNOSIS — E781 Pure hyperglyceridemia: Secondary | ICD-10-CM | POA: Diagnosis not present

## 2019-10-17 DIAGNOSIS — Z0000 Encounter for general adult medical examination without abnormal findings: Secondary | ICD-10-CM | POA: Diagnosis not present

## 2019-10-17 DIAGNOSIS — Z862 Personal history of diseases of the blood and blood-forming organs and certain disorders involving the immune mechanism: Secondary | ICD-10-CM | POA: Diagnosis not present

## 2019-10-17 DIAGNOSIS — I10 Essential (primary) hypertension: Secondary | ICD-10-CM | POA: Diagnosis not present

## 2020-04-09 DIAGNOSIS — I10 Essential (primary) hypertension: Secondary | ICD-10-CM | POA: Diagnosis not present

## 2020-04-09 DIAGNOSIS — E781 Pure hyperglyceridemia: Secondary | ICD-10-CM | POA: Diagnosis not present

## 2020-04-09 DIAGNOSIS — Z862 Personal history of diseases of the blood and blood-forming organs and certain disorders involving the immune mechanism: Secondary | ICD-10-CM | POA: Diagnosis not present

## 2020-04-16 DIAGNOSIS — J310 Chronic rhinitis: Secondary | ICD-10-CM | POA: Diagnosis not present

## 2020-04-16 DIAGNOSIS — E781 Pure hyperglyceridemia: Secondary | ICD-10-CM | POA: Diagnosis not present

## 2020-04-16 DIAGNOSIS — I10 Essential (primary) hypertension: Secondary | ICD-10-CM | POA: Diagnosis not present

## 2020-04-16 DIAGNOSIS — Z862 Personal history of diseases of the blood and blood-forming organs and certain disorders involving the immune mechanism: Secondary | ICD-10-CM | POA: Diagnosis not present

## 2020-06-05 ENCOUNTER — Other Ambulatory Visit: Payer: Self-pay

## 2020-06-05 ENCOUNTER — Observation Stay (HOSPITAL_BASED_OUTPATIENT_CLINIC_OR_DEPARTMENT_OTHER)
Admit: 2020-06-05 | Discharge: 2020-06-05 | Disposition: A | Discharge status: Home / Self Care | Payer: BLUE CROSS/BLUE SHIELD | Attending: Internal Medicine | Admitting: Internal Medicine

## 2020-06-05 ENCOUNTER — Emergency Department: Payer: BLUE CROSS/BLUE SHIELD

## 2020-06-05 ENCOUNTER — Encounter: Payer: Self-pay | Admitting: Medical Oncology

## 2020-06-05 ENCOUNTER — Observation Stay
Admission: EM | Admit: 2020-06-05 | Discharge: 2020-06-06 | Disposition: A | Discharge status: Home / Self Care | Payer: BLUE CROSS/BLUE SHIELD | Attending: Obstetrics and Gynecology | Admitting: Obstetrics and Gynecology

## 2020-06-05 DIAGNOSIS — Z87891 Personal history of nicotine dependence: Secondary | ICD-10-CM | POA: Insufficient documentation

## 2020-06-05 DIAGNOSIS — E785 Hyperlipidemia, unspecified: Secondary | ICD-10-CM | POA: Diagnosis present

## 2020-06-05 DIAGNOSIS — H5711 Ocular pain, right eye: Secondary | ICD-10-CM | POA: Insufficient documentation

## 2020-06-05 DIAGNOSIS — Z20822 Contact with and (suspected) exposure to covid-19: Secondary | ICD-10-CM | POA: Insufficient documentation

## 2020-06-05 DIAGNOSIS — I4892 Unspecified atrial flutter: Secondary | ICD-10-CM | POA: Diagnosis not present

## 2020-06-05 DIAGNOSIS — I483 Typical atrial flutter: Secondary | ICD-10-CM | POA: Diagnosis not present

## 2020-06-05 DIAGNOSIS — I10 Essential (primary) hypertension: Secondary | ICD-10-CM | POA: Diagnosis not present

## 2020-06-05 DIAGNOSIS — M549 Dorsalgia, unspecified: Secondary | ICD-10-CM | POA: Diagnosis not present

## 2020-06-05 DIAGNOSIS — G4733 Obstructive sleep apnea (adult) (pediatric): Secondary | ICD-10-CM

## 2020-06-05 DIAGNOSIS — R079 Chest pain, unspecified: Secondary | ICD-10-CM | POA: Diagnosis not present

## 2020-06-05 DIAGNOSIS — R55 Syncope and collapse: Secondary | ICD-10-CM | POA: Diagnosis not present

## 2020-06-05 DIAGNOSIS — I219 Acute myocardial infarction, unspecified: Secondary | ICD-10-CM | POA: Diagnosis not present

## 2020-06-05 DIAGNOSIS — R42 Dizziness and giddiness: Secondary | ICD-10-CM | POA: Diagnosis not present

## 2020-06-05 DIAGNOSIS — E668 Other obesity: Secondary | ICD-10-CM

## 2020-06-05 DIAGNOSIS — D72829 Elevated white blood cell count, unspecified: Secondary | ICD-10-CM | POA: Diagnosis present

## 2020-06-05 DIAGNOSIS — R059 Cough, unspecified: Secondary | ICD-10-CM | POA: Diagnosis not present

## 2020-06-05 DIAGNOSIS — I959 Hypotension, unspecified: Secondary | ICD-10-CM | POA: Diagnosis not present

## 2020-06-05 DIAGNOSIS — Z79899 Other long term (current) drug therapy: Secondary | ICD-10-CM | POA: Insufficient documentation

## 2020-06-05 DIAGNOSIS — R0602 Shortness of breath: Secondary | ICD-10-CM | POA: Diagnosis not present

## 2020-06-05 HISTORY — DX: Hyperlipidemia, unspecified: E78.5

## 2020-06-05 LAB — BASIC METABOLIC PANEL
Anion gap: 10 (ref 5–15)
BUN: 16 mg/dL (ref 8–23)
CO2: 27 mmol/L (ref 22–32)
Calcium: 9.2 mg/dL (ref 8.9–10.3)
Chloride: 100 mmol/L (ref 98–111)
Creatinine, Ser: 1.13 mg/dL (ref 0.61–1.24)
GFR, Estimated: >60 mL/min (ref >60)
Glucose, Bld: 108 mg/dL — ABNORMAL HIGH (ref 70–99)
Potassium: 3.9 mmol/L (ref 3.5–5.1)
Sodium: 137 mmol/L (ref 135–145)

## 2020-06-05 LAB — TSH: TSH: 2.133 u[IU]/mL (ref 0.350–4.500)

## 2020-06-05 LAB — CBC WITH DIFFERENTIAL/PLATELET
Abs Immature Granulocytes: 0.05 10*3/uL (ref 0.00–0.07)
Basophils Absolute: 0.1 10*3/uL (ref 0.0–0.1)
Basophils Relative: 1 %
Eosinophils Absolute: 0.3 10*3/uL (ref 0.0–0.5)
Eosinophils Relative: 2 %
HCT: 48.8 % (ref 39.0–52.0)
Hemoglobin: 15.7 g/dL (ref 13.0–17.0)
Immature Granulocytes: 0 %
Lymphocytes Relative: 23 %
Lymphs Abs: 3.8 10*3/uL (ref 0.7–4.0)
MCH: 26.2 pg (ref 26.0–34.0)
MCHC: 32.2 g/dL (ref 30.0–36.0)
MCV: 81.5 fL (ref 80.0–100.0)
Monocytes Absolute: 1.2 10*3/uL — ABNORMAL HIGH (ref 0.1–1.0)
Monocytes Relative: 7 %
Neutro Abs: 10.8 10*3/uL — ABNORMAL HIGH (ref 1.7–7.7)
Neutrophils Relative %: 67 %
Platelets: 328 10*3/uL (ref 150–400)
RBC: 5.99 MIL/uL — ABNORMAL HIGH (ref 4.22–5.81)
RDW: 15.2 % (ref 11.5–15.5)
WBC: 16.2 10*3/uL — ABNORMAL HIGH (ref 4.0–10.5)
nRBC: 0 % (ref 0.0–0.2)

## 2020-06-05 LAB — ECHOCARDIOGRAM COMPLETE
AR max vel: 3.65 cm2
AV Area VTI: 3.71 cm2
AV Area mean vel: 3.59 cm2
AV Mean grad: 1 mmHg
AV Peak grad: 2.7 mmHg
Ao pk vel: 0.82 m/s
Area-P 1/2: 5.23 cm2
Height: 69 in
S' Lateral: 3.27 cm
Weight: 4176 oz

## 2020-06-05 LAB — APTT: aPTT: 29 seconds (ref 24–36)

## 2020-06-05 LAB — TROPONIN I (HIGH SENSITIVITY)
Troponin I (High Sensitivity): 7 ng/L (ref <18)
Troponin I (High Sensitivity): 8 ng/L (ref <18)

## 2020-06-05 LAB — RESP PANEL BY RT-PCR (FLU A&B, COVID) ARPGX2
Influenza A by PCR: NEGATIVE
Influenza B by PCR: NEGATIVE
SARS Coronavirus 2 by RT PCR: NEGATIVE

## 2020-06-05 LAB — PROTIME-INR
INR: 1 (ref 0.8–1.2)
Prothrombin Time: 13 seconds (ref 11.4–15.2)

## 2020-06-05 LAB — MAGNESIUM: Magnesium: 2.1 mg/dL (ref 1.7–2.4)

## 2020-06-05 LAB — HEPARIN LEVEL (UNFRACTIONATED): Heparin Unfractionated: 0.46 IU/mL (ref 0.30–0.70)

## 2020-06-05 MED ORDER — FENOFIBRATE 160 MG PO TABS
160.0000 mg | ORAL_TABLET | Freq: Every day | ORAL | Status: DC
  Administered 2020-06-06: 160 mg via ORAL
  Filled 2020-06-05: qty 1

## 2020-06-05 MED ORDER — METOPROLOL TARTRATE 5 MG/5ML IV SOLN
5.0000 mg | INTRAVENOUS | Status: DC | PRN
  Administered 2020-06-05 (×2): 5 mg via INTRAVENOUS
  Filled 2020-06-05 (×3): qty 5

## 2020-06-05 MED ORDER — METOPROLOL TARTRATE 25 MG PO TABS
25.0000 mg | ORAL_TABLET | Freq: Two times a day (BID) | ORAL | Status: DC
  Administered 2020-06-05 – 2020-06-06 (×2): 25 mg via ORAL
  Filled 2020-06-05 (×2): qty 1

## 2020-06-05 MED ORDER — HEPARIN BOLUS VIA INFUSION
5000.0000 [IU] | Freq: Once | INTRAVENOUS | Status: AC
  Administered 2020-06-05: 5000 [IU] via INTRAVENOUS
  Filled 2020-06-05: qty 5000

## 2020-06-05 MED ORDER — ALBUTEROL SULFATE HFA 108 (90 BASE) MCG/ACT IN AERS
2.0000 | INHALATION_SPRAY | RESPIRATORY_TRACT | Status: DC | PRN
  Filled 2020-06-05: qty 6.7

## 2020-06-05 MED ORDER — DM-GUAIFENESIN ER 30-600 MG PO TB12: 1.0000 | ORAL_TABLET | Freq: Two times a day (BID) | ORAL | Status: DC | PRN

## 2020-06-05 MED ORDER — AMOXICILLIN-POT CLAVULANATE 875-125 MG PO TABS
1.0000 | ORAL_TABLET | Freq: Two times a day (BID) | ORAL | Status: DC
  Administered 2020-06-05 – 2020-06-06 (×2): 1 via ORAL
  Filled 2020-06-05 (×2): qty 1

## 2020-06-05 MED ORDER — ONDANSETRON HCL 4 MG/2ML IJ SOLN: 4.0000 mg | Freq: Three times a day (TID) | INTRAMUSCULAR | Status: DC | PRN

## 2020-06-05 MED ORDER — PERFLUTREN LIPID MICROSPHERE
1.0000 mL | INTRAVENOUS | Status: AC | PRN
  Administered 2020-06-05: 2 mL via INTRAVENOUS
  Filled 2020-06-05: qty 10

## 2020-06-05 MED ORDER — HEPARIN (PORCINE) 25000 UT/250ML-% IV SOLN
1500.0000 [IU]/h | INTRAVENOUS | Status: DC
  Administered 2020-06-05 – 2020-06-06 (×2): 1400 [IU]/h via INTRAVENOUS
  Filled 2020-06-05 (×2): qty 250

## 2020-06-05 MED ORDER — LISINOPRIL 10 MG PO TABS
10.0000 mg | ORAL_TABLET | Freq: Every day | ORAL | Status: DC
  Administered 2020-06-05 – 2020-06-06 (×2): 10 mg via ORAL
  Filled 2020-06-05 (×2): qty 1

## 2020-06-05 MED ORDER — ACETAMINOPHEN 325 MG PO TABS: 650.0000 mg | ORAL_TABLET | Freq: Four times a day (QID) | ORAL | Status: DC | PRN

## 2020-06-05 MED ORDER — METOPROLOL TARTRATE 25 MG PO TABS
25.0000 mg | ORAL_TABLET | Freq: Once | ORAL | Status: AC
  Administered 2020-06-05: 25 mg via ORAL
  Filled 2020-06-05: qty 1

## 2020-06-05 MED ORDER — HYDRALAZINE HCL 20 MG/ML IJ SOLN: 5.0000 mg | INTRAMUSCULAR | Status: DC | PRN

## 2020-06-05 NOTE — ED Triage Notes (Signed)
Patient to ER for shortness of breath, hypotension, ear pain to right ear. Was seen at [facility] and sent to ER for STEMI.

## 2020-06-05 NOTE — ED Triage Notes (Signed)
Pt sent over from [facility] for abnormal EKG. Pt reports that he went bc he was feeling lightheaded and sob. Pt denies pain.

## 2020-06-05 NOTE — Consult Note (Signed)
ANTICOAGULATION CONSULT NOTE - Initial Consult  Pharmacy Consult for heparin drip Indication: a fib  No Known Allergies  Patient Measurements: Height: 5\' 9"  (175.3 cm) Weight: 118.4 kg (261 lb) IBW/kg (Calculated) : 70.7 Heparin Dosing Weight: 97.4kg  Vital Signs: Temp: 97.6 F (36.4 C) (12/07 1041) Temp Source: Oral (12/07 1041) BP: 122/99 (12/07 1115) Pulse Rate: 136 (12/07 1115)  Labs: Recent Labs    06/05/20 1044  HGB 15.7  HCT 48.8  PLT 328    CrCl cannot be calculated (Patient's most recent lab result is older than the maximum 21 days allowed.).   Medical History: Past Medical History:  Diagnosis Date  . Hypertension     Medications:  No PTA anticoagulation of record  Assessment: 69 yo male sent for [facility] for abnormal EKG - lightheaded and SOB, troponin's pending.  Pharmacy has been consulted to initiate and monitor a heparin drip.  Goal of Therapy:  Heparin level 0.3-0.7 units/ml Monitor platelets by anticoagulation protocol: Yes   Plan:  Give 5000 units bolus x 1 Start heparin infusion at 1400 units/hr Check anti-Xa level in 6 hours and daily while on heparin Continue to monitor H&H and platelets  Will obtain baseline INR/aPtt per protocol  [facility], PharmD, BCPS Clinical Pharmacist 06/05/2020 11:20 AM'

## 2020-06-05 NOTE — ED Provider Notes (Signed)
[facility] Emergency Department Provider Note   ____________________________________________   First MD Initiated Contact with Patient 06/05/20 1042     (approximate)  I have reviewed the triage vital signs and the nursing notes.   HISTORY  Chief Complaint Hypotension    HPI Peter Hartman is a 69 y.o. male with past medical history of hypertension who presents to the ED complaining of lightheadedness.  Patient reports that earlier this morning he was feeling lightheaded and like he might pass out, so he decided to check his blood pressure at home.  He states he noticed it was low and so he decided to present to the walk-in clinic at [facility].  He has been dealing with a dry cough and some soreness in his back when he coughs for about the past week, but denies any fevers, shortness of breath, or chest pain.  He was found to be in atrial flutter with possible ST elevation while at [facility], subsequently sent over the ED for further evaluation.  He denies any history of atrial fibrillation or atrial flutter.        Past Medical History:  Diagnosis Date  . Hypertension     There are no problems to display for this patient.   Past Surgical History:  Procedure Laterality Date  . HERNIA REPAIR      Prior to Admission medications   Medication Sig Start Date End Date Taking? Authorizing Provider  fenofibrate micronized (LOFIBRA) 200 MG capsule Take 200 mg by mouth daily. 05/29/18   [provider]  hydrochlorothiazide (HYDRODIURIL) 25 MG tablet Take 25 mg by mouth daily.    [provider]  lisinopril (PRINIVIL,ZESTRIL) 20 MG tablet Take 20 mg by mouth daily.    [provider]  sildenafil (REVATIO) 20 MG tablet Take 1-5 tablets as needed 01/26/19   Jerilee Field, MD    Allergies Patient has no known allergies.  Family History  Problem Relation Age of Onset  . Prostate cancer Neg Hx   . Bladder Cancer Neg Hx   .  Kidney cancer Neg Hx     Social History Social History   Tobacco Use  . Smoking status: Former Smoker    Types: Cigarettes    Quit date: 04/26/2016    Years since quitting: 4.1  . Smokeless tobacco: Never Used  Vaping Use  . Vaping Use: Never used  Substance Use Topics  . Alcohol use: Yes  . Drug use: Never    Review of Systems  Constitutional: No fever/chills Eyes: No visual changes. ENT: No sore throat. Cardiovascular: Denies chest pain.  Positive for lightheadedness. Respiratory: Denies shortness of breath.  Positive for cough. Gastrointestinal: No abdominal pain.  No nausea, no vomiting.  No diarrhea.  No constipation. Genitourinary: Negative for dysuria. Musculoskeletal: Positive for for back pain. Skin: Negative for rash. Neurological: Negative for headaches, focal weakness or numbness.  ____________________________________________   PHYSICAL EXAM:  VITAL SIGNS: ED Triage Vitals  Enc Vitals Group     BP --      Pulse Rate 06/05/20 1041 (!) 144     Resp 06/05/20 1041 20     Temp 06/05/20 1041 97.6 F (36.4 C)     Temp Source 06/05/20 1041 Oral     SpO2 06/05/20 1041 99 %     Weight 06/05/20 1042 261 lb (118.4 kg)     Height 06/05/20 1042 5\' 9"  (1.753 m)     Head Circumference --  Peak Flow --      Pain Score 06/05/20 1042 0     Pain Loc --      Pain Edu? --      Excl. in GC? --     Constitutional: Alert and oriented. Eyes: Conjunctivae are normal. Head: Atraumatic. Nose: No congestion/rhinnorhea. Mouth/Throat: Mucous membranes are moist. Neck: Normal ROM Cardiovascular: Tachycardic, regular rhythm. Grossly normal heart sounds.  2+ radial pulses bilaterally. Respiratory: Normal respiratory effort.  No retractions. Lungs CTAB. Gastrointestinal: Soft and nontender. No distention. Genitourinary: deferred Musculoskeletal: No lower extremity tenderness nor edema. Neurologic:  Normal speech and language. No gross focal neurologic deficits are  appreciated. Skin:  Skin is warm, dry and intact. No rash noted. Psychiatric: Mood and affect are normal. Speech and behavior are normal.  ____________________________________________   LABS (all labs ordered are listed, but only abnormal results are displayed)  Labs Reviewed  CBC WITH DIFFERENTIAL/PLATELET - Abnormal; Notable for the following components:      Result Value   WBC 16.2 (*)    RBC 5.99 (*)    Neutro Abs 10.8 (*)    Monocytes Absolute 1.2 (*)    All other components within normal limits  BASIC METABOLIC PANEL - Abnormal; Notable for the following components:   Glucose, Bld 108 (*)    All other components within normal limits  RESP PANEL BY RT-PCR (FLU A&B, COVID) ARPGX2  MAGNESIUM  TSH  APTT  PROTIME-INR  HEPARIN LEVEL (UNFRACTIONATED)  TROPONIN I (HIGH SENSITIVITY)  TROPONIN I (HIGH SENSITIVITY)   ____________________________________________  EKG  ED ECG REPORT I, Chesley Noon, the attending physician, personally viewed and interpreted this ECG.   Date: 06/05/2020  EKG Time: 10:40  Rate: 146  Rhythm: Atrial Flutter with 2:1 conduction  Axis: Normal  Intervals:none  ST&T Change: None   PROCEDURES  Procedure(s) performed (including Critical Care):  .1-3 Lead EKG Interpretation Performed by: Chesley Noon, MD Authorized by: Chesley Noon, MD     Interpretation: abnormal     ECG rate:  145   ECG rate assessment: tachycardic     Rhythm: atrial flutter     Ectopy: none     Conduction: normal   .Critical Care Performed by: Chesley Noon, MD Authorized by: Chesley Noon, MD   Critical care provider statement:    Critical care time (minutes):  45   Critical care time was exclusive of:  Separately billable procedures and treating other patients and teaching time   Critical care was necessary to treat or prevent imminent or life-threatening deterioration of the following conditions:  Cardiac failure   Critical care was time spent  personally by me on the following activities:  Discussions with consultants, evaluation of patient's response to treatment, examination of patient, ordering and performing treatments and interventions, ordering and review of laboratory studies, ordering and review of radiographic studies, pulse oximetry, re-evaluation of patient's condition, obtaining history from patient or surrogate and review of old charts   I assumed direction of critical care for this patient from another provider in my specialty: no       ____________________________________________   INITIAL IMPRESSION / ASSESSMENT AND PLAN / ED COURSE       69 year old male with past medical history of hypertension presents to the ED after feeling lightheaded at home, was found to be in atrial flutter with questionable ST elevation at [facility] walk-in clinic.  He currently denies any chest pain, lightheadedness, or shortness of breath.  Repeat EKG here shows atrial flutter with  2-1 conduction but no obvious ischemic changes.  Plan to discuss with cardiology, but no reason to suspect STEMI at this time.  We will check labs including troponin, attempt to control his heart rate with IV metoprolol.  Patient's heart rate is now improved following 2 doses of IV metoprolol, we will now give p.o. dose as well.  Blood pressure remained stable and patient has had no chest pain, shortness of breath, or lightheadedness.  Chest x-ray reviewed by me and shows no infiltrate, edema, or effusion.  2 sets of troponin are negative and there is no evidence of ACS at this time.  Case discussed with Dr. Okey Dupre of cardiology, who agrees with treatment with metoprolol, also recommend starting heparin.  Case discussed with hospitalist for admission.      ____________________________________________   FINAL CLINICAL IMPRESSION(S) / ED DIAGNOSES  Final diagnoses:  Atrial flutter, unspecified type (HCC)  Near syncope     ED Discharge Orders    None        Note:  This document was prepared using Dragon voice recognition software and may include unintentional dictation errors.   Chesley Noon, MD 06/05/20 1334

## 2020-06-05 NOTE — ED Notes (Signed)
Warm blanket provided. Wife at bedside.

## 2020-06-05 NOTE — Progress Notes (Signed)
*  PRELIMINARY RESULTS* Echocardiogram 2D Echocardiogram has been performed.  Peter Hartman 06/05/2020, 5:25 PM

## 2020-06-05 NOTE — ED Notes (Signed)
EDP at bedside  

## 2020-06-05 NOTE — ED Notes (Signed)
Lab called. Green top needs redraw for BMP.

## 2020-06-05 NOTE — Consult Note (Signed)
Cardiology Consultation:   Patient ID: Peter Hartman; [id number]; Jun 04, 1951   Admit date: 06/05/2020 Date of Consult: 06/05/2020  Primary Care Provider: Marisue Ivan, MD Primary Cardiologist: New to [facility] [place] - consult by End Primary Electrophysiologist:  None   Patient Profile:   Peter Hartman is a 69 y.o. 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 Clyde Lundborg.  History of Present Illness:   Mr. Rodas does not have any previously known cardiac history. He does report an episode of palpitations that occurred 6 months ago without further workup at that time.   On 12/6, he was feeling dizzy, so he check his BP at home and got a reading in the 90s mmHg systolic with a heart rate in the 140s bpm. In this setting, he made an appointment for today. He denied any chest pain, dyspnea, palpitations, lower extremity swelling, or orthopnea. He presented to [facility] earlier this morning with complaints of lightheadedness and presyncope. He reported he had been dealing with a dry cough for the preceding week. He denied any dyspnea, chest pain, fevers, or chills. At urgent care, he was noted to be in new onset atrial flutter with RVR with ventricular rates in the 140s bpm. There was some question of possible ST elevation, not meeting stemi criteria. In this setting, he was sent to the ED where he remained in atrial flutter with RVR with ventricular rates in the 140s bpm. BP 140s systolic. BP stable. No EKG available for review. Telemetry showed atrial flutter with RVR improving to rate controlled atrial flutter with variable AV block. CXR showed no acute cardiopulmonary process. Labs showed HS-Tn  7 with a delta of 8, WBC 16.2, HGB 15.7, PLT 328, potassium 3.9, BUN/SCr 16/1.13, TSH normal, Covid/influenza negative. In the ED he was given IV Lopressor 5 mg x 2 with improvement in ventricular rates and has been placed on oral Lopressor with ventricular rates currently in the 90s bpm with occasional PVCs. He denies any chest pain, dyspnea, palpitations, dizziness, presyncope, or syncope at this time.     Past Medical History:  Diagnosis Date  . HLD (hyperlipidemia)   . Hypertension     Past Surgical History:  Procedure Laterality Date  . HERNIA REPAIR       Home Meds: Prior to Admission medications   Medication Sig Start Date End Date Taking? Authorizing Provider  fenofibrate micronized (LOFIBRA) 200 MG capsule Take 200 mg by mouth daily. 05/29/18   [provider]  hydrochlorothiazide (HYDRODIURIL) 25 MG tablet Take 25 mg by mouth daily.    [provider]  lisinopril (ZESTRIL) 10 MG tablet Take 10 mg by mouth daily. 02/16/20   [provider]  sildenafil (VIAGRA) 50 MG tablet Take 50 mg by mouth at bedtime. 01/20/20   [provider]    Inpatient Medications: Scheduled Meds:  Continuous Infusions: . heparin 1,400 Units/hr (06/05/20 1254)   PRN Meds: acetaminophen, albuterol, dextromethorphan-guaiFENesin, hydrALAZINE, metoprolol tartrate, ondansetron (ZOFRAN) IV  Allergies:  No Known Allergies  Social History:   Social History   Socioeconomic History  . Marital status: Married    Spouse name: Not on file  . Number of  children: Not on file  . Years of education: Not on file  . Highest education level: Not on file  Occupational History  . Not on file  Tobacco Use  . Smoking status: Former Smoker    Types: Cigarettes    Quit date: 04/26/2016    Years since quitting: 4.1  . Smokeless tobacco: Never Used  Vaping Use  . Vaping Use: Never used  Substance and Sexual Activity  . Alcohol use: Yes  . Drug use: Never  . Sexual  activity: Not on file  Other Topics Concern  . Not on file  Social History Narrative  . Not on file   Social Determinants of Health   Financial Resource Strain:   . Difficulty of Paying Living Expenses: Not on file  Food Insecurity:   . Worried About Programme researcher, broadcasting/film/video in the Last Year: Not on file  . Ran Out of Food in the Last Year: Not on file  Transportation Needs:   . Lack of Transportation (Medical): Not on file  . Lack of Transportation (Non-Medical): Not on file  Physical Activity:   . Days of Exercise per Week: Not on file  . Minutes of Exercise per Session: Not on file  Stress:   . Feeling of Stress : Not on file  Social Connections:   . Frequency of Communication with Friends and Family: Not on file  . Frequency of Social Gatherings with Friends and Family: Not on file  . Attends Religious Services: Not on file  . Active Member of Clubs or Organizations: Not on file  . Attends Banker Meetings: Not on file  . Marital Status: Not on file  Intimate Partner Violence:   . Fear of Current or Ex-Partner: Not on file  . Emotionally Abused: Not on file  . Physically Abused: Not on file  . Sexually Abused: Not on file     Family History:  Family History  Problem Relation Age of Onset  . Prostate cancer Neg Hx   . Bladder Cancer Neg Hx   . Kidney cancer Neg Hx     ROS:  Review of Systems  Constitutional: Positive for malaise/fatigue. Negative for chills, diaphoresis, fever and weight loss.  HENT: Negative for congestion.   Eyes: Negative for discharge and redness.  Respiratory: Negative for cough, sputum production, shortness of breath and wheezing.   Cardiovascular: Negative for chest pain, palpitations, orthopnea, claudication, leg swelling and PND.  Gastrointestinal: Negative for abdominal pain, blood in stool, heartburn, melena, nausea and vomiting.  Musculoskeletal: Negative for falls and myalgias.  Skin: Negative for rash.  Neurological:  Positive for dizziness and weakness. Negative for tingling, tremors, sensory change, speech change, focal weakness and loss of consciousness.  Endo/Heme/Allergies: Does not bruise/bleed easily.  Psychiatric/Behavioral: Negative for substance abuse. The patient is not nervous/anxious.   All other systems reviewed and are negative.     Physical Exam/Data:   Vitals:   06/05/20 1256 06/05/20 1300 06/05/20 1330 06/05/20 1400  BP: 130/82 133/76 (!) 148/80 113/74  Pulse: 95 (!) 46 74 (!) 48  Resp:  18 (!) 25 20  Temp:      TempSrc:      SpO2:  100% 98% 99%  Weight:      Height:       No intake or output data in the 24 hours ending 06/05/20 1420 Filed Weights   06/05/20 1042  Weight: 118.4 kg   Body mass index is 38.54 kg/m.   Physical  Exam: General: Well developed, well nourished, in no acute distress. Head: Normocephalic, atraumatic, sclera non-icteric, no xanthomas, nares without discharge.  Neck: Negative for carotid bruits. JVD not elevated. Lungs: Clear bilaterally to auscultation without wheezes, rales, or rhonchi. Breathing is unlabored. Heart: Irregular with S1 S2. No murmurs, rubs, or gallops appreciated. Abdomen: Soft, non-tender, non-distended with normoactive bowel sounds. No hepatomegaly. No rebound/guarding. No obvious abdominal masses. Msk:  Strength and tone appear normal for age. Extremities: No clubbing or cyanosis. No edema. Distal pedal pulses are 2+ and equal bilaterally. Neuro: Alert and oriented X 3. No facial asymmetry. No focal deficit. Moves all extremities spontaneously. Psych:  Responds to questions appropriately with a normal affect.   EKG:  The EKG was personally reviewed and demonstrates: not available for review in Epic Telemetry:  Telemetry was personally reviewed and demonstrates: atrial flutter with RVR improving to rate controlled flutter with variable AV block  Weights: Filed Weights   06/05/20 1042  Weight: 118.4 kg    Relevant CV  Studies: 2D echo pending  Laboratory Data:  Chemistry Recent Labs  Lab 06/05/20 1238  NA 137  K 3.9  CL 100  CO2 27  GLUCOSE 108*  BUN 16  CREATININE 1.13  CALCIUM 9.2  GFRNONAA >60  ANIONGAP 10    No results for input(s): PROT, ALBUMIN, AST, ALT, ALKPHOS, BILITOT in the last 168 hours. Hematology Recent Labs  Lab 06/05/20 1044  WBC 16.2*  RBC 5.99*  HGB 15.7  HCT 48.8  MCV 81.5  MCH 26.2  MCHC 32.2  RDW 15.2  PLT 328   Cardiac EnzymesNo results for input(s): TROPONINI in the last 168 hours. No results for input(s): TROPIPOC in the last 168 hours.  BNPNo results for input(s): BNP, PROBNP in the last 168 hours.  DDimer No results for input(s): DDIMER in the last 168 hours.  Radiology/Studies:  DG Chest Portable 1 View  Result Date: 06/05/2020 IMPRESSION: Stable exam.  No evidence of active disease. Electronically Signed   By: Marnee Spring M.D.   On: 06/05/2020 11:22    Assessment and Plan:   1. New onset atrial flutter with RVR: -He remains in atrial flutter with variable AV block with improved ventricular rates in the 90s bpm currently -Possibly in the setting of untreated OSA with underlying morbid obesity -Given he is unable to feel palpitations at this time, cannot exclude silent episodes -Continue rate control with Lopressor, titrate as needed -Echo pending -HS-Tn normal x 2 -CHADS2VASc at least 2 -Continue heparin gtt for now, if there are no significant structural abnormalities, transition to Eliquis prior to discharge  -Ambulate to assess for heart rate and symptom control, if these remain well controlled over the next 24 hours, he can likely be discharged on 12/8 with outpatient follow up for planned DCCV, if he remains in atrial flutter, after he has been adequately anticoagulated without interruption for 3-4 weeks -If his ventricular rates are difficult to control, or if he is symptomatic with ambulation, and he has been placed on maximally  tolerated medical therapy for rate control, he may require TEE-guided DCCV prior to discharge  -Recommend EP evaluation as an outpatient for consideration of atrial flutter ablation -TSH, potassium and magnesium normal -Order EKG for Epic  2. HTN: -Blood pressure currently well controlled -Lopressor   3. Morbid obesity with OSA: -Weight loss advised -Has an old CPAP at home, though not using -Needs outpatient follow up with pulmonology for sleep study and titration of CPAP  For questions or updates, please contact [facility] Please consult [email or web address] for contact info under Cardiology/STEMI.   Signed, Eula Listen, PA-C [facility] Pager: [phone] 06/05/2020, 2:20 PM

## 2020-06-05 NOTE — Consult Note (Signed)
ANTICOAGULATION CONSULT NOTE - Initial Consult  Pharmacy Consult for heparin drip Indication: a fib  No Known Allergies  Patient Measurements: Height: 5\' 9"  (175.3 cm) Weight: 118.4 kg (261 lb) IBW/kg (Calculated) : 70.7 Heparin Dosing Weight: 97.4kg  Vital Signs: Temp: 97.9 F (36.6 C) (12/07 2003) Temp Source: Oral (12/07 2003) BP: 142/91 (12/07 2003) Pulse Rate: 85 (12/07 2003)  Labs: Recent Labs    06/05/20 1044 06/05/20 1131 06/05/20 1238 06/05/20 1751  HGB 15.7  --   --   --   HCT 48.8  --   --   --   PLT 328  --   --   --   APTT  --  29  --   --   LABPROT  --  13.0  --   --   INR  --  1.0  --   --   HEPARINUNFRC  --   --   --  0.46  CREATININE  --   --  1.13  --   TROPONINIHS 7  --  8  --     Estimated Creatinine Clearance: 80.6 mL/min (by C-G formula based on SCr of 1.13 mg/dL).   Medical History: Past Medical History:  Diagnosis Date  . HLD (hyperlipidemia)   . Hypertension     Medications:  No PTA anticoagulation of record  Assessment: 69 yo male sent for [facility] for abnormal EKG - lightheaded and SOB, troponin's pending.  Pharmacy has been consulted to initiate and monitor a heparin drip.  Goal of Therapy:  Heparin level 0.3-0.7 units/ml Monitor platelets by anticoagulation protocol: Yes   Plan:  Give 5000 units bolus x 1 Start heparin infusion at 1400 units/hr Check anti-Xa level in 6 hours and daily while on heparin Continue to monitor H&H and platelets  Will obtain baseline INR/aPtt per protocol  12/7 @ 1751 HL 0.46, therapeutic x 1.  Will continue Heparin at current rate and recheck HL in am to confirm.  14/7, PharmD Clinical Pharmacist 06/05/2020 10:31 PM'

## 2020-06-05 NOTE — ED Notes (Signed)
Wife at bedside. Pt denies CP, SOB. Pt mentating appropriately.

## 2020-06-05 NOTE — H&P (Addendum)
History and Physical    Peter Hartman PXT:[id number] DOB: [birth year] DOA: 06/05/2020  Referring MD/NP/PA:   PCP: Marisue Ivan, MD   Patient coming from:  The patient is coming from home.  At baseline, pt is independent for most of ADL.        Chief Complaint: Lightheadedness  HPI: Peter Hartman is a 69 y.o. male with medical history significant of hypertension, hyperlipidemia, OSA not use CPAP consistently, who presents with lightheadedness.  Patient states that he has been having lightheadedness since yesterday, no unilateral numbness or tingling in extremities, no facial droop or slurred speech.  He also reports dry cough and had mild shortness of breath earlier which has resolved.  No chest pain.  He states that he has some right ear pain without any drainage.  Denies nausea, vomiting, diarrhea, abdominal pain, symptoms of UTI.   Pt was seen in urgent care today, and found to have new onset atrial flutter with RVR with ventricular rates in the 140s, EKG showed some question of possible ST elevation, not meeting stemi criteria. He was sent to ED for further evaluation and treatment. In the ED he was given IV Lopressor 5 mg x 2 with improvement in ventricular rates to 90s-100s.   ED Course: pt was found to have WBC 16.2, negative Covid PCR, troponin level 7, 8, INR 1.0, electrolytes renal function okay, GFR> 60, temperature normal, blood pressure 133/76, RR 24, 16, oxygen saturation 97% on room air.  Chest x-ray negative.  Patient is placed on progressive bed for observation cardiology, Dr. Okey Dupre is consulted.  Review of Systems:   General: no fevers, chills, no body weight gain, has fatigue HEENT: no blurry vision, hearing changes or sore throat.  Has right ear pain. Respiratory: Has dyspnea, coughing, no wheezing CV: no chest pain, no palpitations GI: no nausea, vomiting, abdominal pain, diarrhea, constipation GU: no dysuria, burning on urination, increased urinary frequency,  hematuria  Ext: no leg edema Neuro: no unilateral weakness, numbness, or tingling, no vision change or hearing loss.  Has lightheadedness. Skin: no rash, no skin tear. MSK: No muscle spasm, no deformity, no limitation of range of movement in spin Heme: No easy bruising.  Travel history: No recent long distant travel.  Allergy: No Known Allergies  Past Medical History:  Diagnosis Date  . HLD (hyperlipidemia)   . Hypertension     Past Surgical History:  Procedure Laterality Date  . HERNIA REPAIR      Social History:  reports that he quit smoking about 4 years ago. His smoking use included cigarettes. He has never used smokeless tobacco. He reports current alcohol use. He reports that he does not use drugs.  Family History:  Family History  Problem Relation Age of Onset  . Prostate cancer Neg Hx   . Bladder Cancer Neg Hx   . Kidney cancer Neg Hx      Prior to Admission medications   Medication Sig Start Date End Date Taking? Authorizing Provider  fenofibrate micronized (LOFIBRA) 200 MG capsule Take 200 mg by mouth daily. 05/29/18   [provider]  hydrochlorothiazide (HYDRODIURIL) 25 MG tablet Take 25 mg by mouth daily.    [provider]  lisinopril (PRINIVIL,ZESTRIL) 20 MG tablet Take 20 mg by mouth daily.    [provider]  sildenafil (REVATIO) 20 MG tablet Take 1-5 tablets as needed 01/26/19   Jerilee Field, MD    Physical Exam: Vitals:   06/05/20 1400 06/05/20 1445 06/05/20 1600  06/05/20 1700  BP: 113/74 134/80 123/66 (!) 148/93  Pulse: (!) 48 (!) 48 72 68  Resp: 20 (!) 21 17 15   Temp:      TempSrc:      SpO2: 99% 97% 98% 99%  Weight:      Height:       General: Not in acute distress HEENT:       Eyes: PERRL, EOMI, no scleral icterus.       ENT: No discharge from the ears and nose, no pharynx injection, no tonsillar enlargement.        Neck: No JVD, no bruit, no mass felt. Heme: No neck lymph node enlargement. Cardiac: S1/S2,  RRR, No murmurs, No gallops or rubs. Respiratory: No rales, wheezing, rhonchi or rubs. GI: Soft, nondistended, nontender, no rebound pain, no organomegaly, BS present. GU: No hematuria Ext: No pitting leg edema bilaterally. 2+DP/PT pulse bilaterally. Musculoskeletal: No joint deformities, No joint redness or warmth, no limitation of ROM in spin. Skin: No rashes.  Neuro: Alert, oriented X3, cranial nerves II-XII grossly intact, moves all extremities normally. Psych: Patient is not psychotic, no suicidal or hemocidal ideation.  Labs on Admission: I have personally reviewed following labs and imaging studies  CBC: Recent Labs  Lab 06/05/20 1044  WBC 16.2*  NEUTROABS 10.8*  HGB 15.7  HCT 48.8  MCV 81.5  PLT 328   Basic Metabolic Panel: Recent Labs  Lab 06/05/20 1044 06/05/20 1238  NA  --  137  K  --  3.9  CL  --  100  CO2  --  27  GLUCOSE  --  108*  BUN  --  16  CREATININE  --  1.13  CALCIUM  --  9.2  MG 2.1  --    GFR: Estimated Creatinine Clearance: 80.6 mL/min (by C-G formula based on SCr of 1.13 mg/dL). Liver Function Tests: No results for input(s): AST, ALT, ALKPHOS, BILITOT, PROT, ALBUMIN in the last 168 hours. No results for input(s): LIPASE, AMYLASE in the last 168 hours. No results for input(s): AMMONIA in the last 168 hours. Coagulation Profile: Recent Labs  Lab 06/05/20 1131  INR 1.0   Cardiac Enzymes: No results for input(s): CKTOTAL, CKMB, CKMBINDEX, TROPONINI in the last 168 hours. BNP (last 3 results) No results for input(s): PROBNP in the last 8760 hours. HbA1C: No results for input(s): HGBA1C in the last 72 hours. CBG: No results for input(s): GLUCAP in the last 168 hours. Lipid Profile: No results for input(s): CHOL, HDL, LDLCALC, TRIG, CHOLHDL, LDLDIRECT in the last 72 hours. Thyroid Function Tests: Recent Labs    06/05/20 1044  TSH 2.133   Anemia Panel: No results for input(s): VITAMINB12, FOLATE, FERRITIN, TIBC, IRON, RETICCTPCT in  the last 72 hours. Urine analysis: No results found for: COLORURINE, APPEARANCEUR, LABSPEC, PHURINE, GLUCOSEU, HGBUR, BILIRUBINUR, KETONESUR, PROTEINUR, UROBILINOGEN, NITRITE, LEUKOCYTESUR Sepsis Labs: @LABRCNTIP (procalcitonin:4,lacticidven:4) ) Recent Results (from the past 240 hour(s))  Resp Panel by RT-PCR (Flu A&B, Covid) Nasopharyngeal Swab     Status: None   Collection Time: 06/05/20 10:54 AM   Specimen: Nasopharyngeal Swab; Nasopharyngeal(NP) swabs in vial transport medium  Result Value Ref Range Status   SARS Coronavirus 2 by RT PCR NEGATIVE NEGATIVE Final    Comment: (NOTE) SARS-CoV-2 target nucleic acids are NOT DETECTED.  The SARS-CoV-2 RNA is generally detectable in upper respiratory specimens during the acute phase of infection. The lowest concentration of SARS-CoV-2 viral copies this assay can detect is 138 copies/mL. A negative result does  not preclude SARS-Cov-2 infection and should not be used as the sole basis for treatment or other patient management decisions. A negative result may occur with  improper specimen collection/handling, submission of specimen other than nasopharyngeal swab, presence of viral mutation(s) within the areas targeted by this assay, and inadequate number of viral copies(<138 copies/mL). A negative result must be combined with clinical observations, patient history, and epidemiological information. The expected result is Negative.  Fact Sheet for Patients:  BloggerCourse.com  Fact Sheet for Healthcare Providers:  SeriousBroker.it  This test is no t yet approved or cleared by the [place] FDA and  has been authorized for detection and/or diagnosis of SARS-CoV-2 by FDA under an Emergency Use Authorization (EUA). This EUA will remain  in effect (meaning this test can be used) for the duration of the COVID-19 declaration under Section 564(b)(1) of the Act, 21 U.S.C.section  360bbb-3(b)(1), unless the authorization is terminated  or revoked sooner.       Influenza A by PCR NEGATIVE NEGATIVE Final   Influenza B by PCR NEGATIVE NEGATIVE Final    Comment: (NOTE) The Xpert Xpress SARS-CoV-2/FLU/RSV plus assay is intended as an aid in the diagnosis of influenza from Nasopharyngeal swab specimens and should not be used as a sole basis for treatment. Nasal washings and aspirates are unacceptable for Xpert Xpress SARS-CoV-2/FLU/RSV testing.  Fact Sheet for Patients: BloggerCourse.com  Fact Sheet for Healthcare Providers: SeriousBroker.it  This test is not yet approved or cleared by the [place] FDA and has been authorized for detection and/or diagnosis of SARS-CoV-2 by FDA under an Emergency Use Authorization (EUA). This EUA will remain in effect (meaning this test can be used) for the duration of the COVID-19 declaration under Section 564(b)(1) of the Act, 21 U.S.C. section 360bbb-3(b)(1), unless the authorization is terminated or revoked.  Performed at [facility], [address] [place], [place] [address]      Radiological Exams on Admission: DG Chest Portable 1 View  Result Date: 06/05/2020 CLINICAL DATA:  Chest pain EXAM: PORTABLE CHEST 1 VIEW COMPARISON:  10/11/2011 FINDINGS: Normal heart size and mediastinal contours. No acute infiltrate or edema. No effusion or pneumothorax. No acute osseous findings. IMPRESSION: Stable exam.  No evidence of active disease. Electronically Signed   By: Marnee Spring M.D.   On: 06/05/2020 11:22     EKG: I have personally reviewed.  Atrial flutter, QTC 457, tachycardia   Assessment/Plan Principal Problem:   Atrial flutter with rapid ventricular response (HCC) Active Problems:   Leukocytosis   Hypertension   HLD (hyperlipidemia)   Atrial flutter with rapid ventricular response (HCC): HR 140s -->90-100s.  No chest pain.  Troponin level 7,  8.  TSH 2.133 today.  CHA2DS2-VASc Score is 2, needs oral anticoagulation. Dr. Okey Dupre of cardiology is consulted.  -Placed on progressive bed of observation -Metoprolol 25 mg twice daily -As needed IV metoprolol for rate control -2D echo -IV heparin -->may switch to Eliquis at discharge   Hypertension -IV hydralazine as needed -Continue home lisinopril -Hold HCTZ since metoprolol is added  HLD (hyperlipidemia) -Continue home fenofibrate  Leukocytosis: WBC 16.2.  No fever.  Patient has right ear pain though no active drainage. -Start Augmentin empirically for possible right ear otitis media -Blood culture    DVT ppx: on IV Heparin  Code Status: Full code Family Communication:  Yes, patient's wife at bed side Disposition Plan:  Anticipate discharge back to previous environment Consults called:  Dr. Okey Dupre Admission status: progressive unit as  inpt     Status is: Observation  The patient remains OBS appropriate and will d/c before 2 midnights.  Dispo: The patient is from: Home              Anticipated d/c is to: Home              Anticipated d/c date is: 1 day              Patient currently is not medically stable to d/c.          Date of Service 06/05/2020    Lorretta Harp [facility]   If 7PM-7AM, please contact night-coverage [email or web address] 06/05/2020, 5:25 PM

## 2020-06-06 DIAGNOSIS — I10 Essential (primary) hypertension: Secondary | ICD-10-CM | POA: Diagnosis not present

## 2020-06-06 DIAGNOSIS — I4892 Unspecified atrial flutter: Secondary | ICD-10-CM | POA: Diagnosis not present

## 2020-06-06 LAB — BASIC METABOLIC PANEL
Anion gap: 10 (ref 5–15)
BUN: 17 mg/dL (ref 8–23)
CO2: 25 mmol/L (ref 22–32)
Calcium: 8.7 mg/dL — ABNORMAL LOW (ref 8.9–10.3)
Chloride: 103 mmol/L (ref 98–111)
Creatinine, Ser: 0.95 mg/dL (ref 0.61–1.24)
GFR, Estimated: >60 mL/min (ref >60)
Glucose, Bld: 103 mg/dL — ABNORMAL HIGH (ref 70–99)
Potassium: 3.5 mmol/L (ref 3.5–5.1)
Sodium: 138 mmol/L (ref 135–145)

## 2020-06-06 LAB — CBC
HCT: 41.2 % (ref 39.0–52.0)
Hemoglobin: 13.4 g/dL (ref 13.0–17.0)
MCH: 26.4 pg (ref 26.0–34.0)
MCHC: 32.5 g/dL (ref 30.0–36.0)
MCV: 81.1 fL (ref 80.0–100.0)
Platelets: 254 10*3/uL (ref 150–400)
RBC: 5.08 MIL/uL (ref 4.22–5.81)
RDW: 14.9 % (ref 11.5–15.5)
WBC: 9 10*3/uL (ref 4.0–10.5)
nRBC: 0 % (ref 0.0–0.2)

## 2020-06-06 LAB — HEPARIN LEVEL (UNFRACTIONATED): Heparin Unfractionated: 0.29 IU/mL — ABNORMAL LOW (ref 0.30–0.70)

## 2020-06-06 LAB — HIV ANTIBODY (ROUTINE TESTING W REFLEX): HIV Screen 4th Generation wRfx: NONREACTIVE

## 2020-06-06 MED ORDER — APIXABAN 5 MG PO TABS
5.0000 mg | ORAL_TABLET | Freq: Two times a day (BID) | ORAL | 3 refills | Status: DC
Start: 2020-06-06 — End: 2020-10-23

## 2020-06-06 MED ORDER — METOPROLOL SUCCINATE ER 50 MG PO TB24: 50.0000 mg | ORAL_TABLET | Freq: Every day | ORAL | 1 refills | Status: DC

## 2020-06-06 MED ORDER — APIXABAN 5 MG PO TABS
5.0000 mg | ORAL_TABLET | Freq: Two times a day (BID) | ORAL | Status: DC
  Administered 2020-06-06: 5 mg via ORAL
  Filled 2020-06-06: qty 1

## 2020-06-06 NOTE — Progress Notes (Signed)
Discussed discharge information with patient, including follow up appointments and medications.   Work note sent home.

## 2020-06-06 NOTE — Consult Note (Signed)
ANTICOAGULATION CONSULT NOTE - Initial Consult  Pharmacy Consult for heparin drip Indication: a fib  No Known Allergies  Patient Measurements: Height: 5\' 9"  (175.3 cm) Weight: 118.4 kg (261 lb) IBW/kg (Calculated) : 70.7 Heparin Dosing Weight: 97.4kg  Vital Signs: Temp: 97.5 F (36.4 C) (12/08 0352) Temp Source: Oral (12/08 0352) BP: 120/68 (12/08 0352) Pulse Rate: 73 (12/08 0352)  Labs: Recent Labs    06/05/20 1044 06/05/20 1131 06/05/20 1238 06/05/20 1751 06/06/20 0509  HGB 15.7  --   --   --  13.4  HCT 48.8  --   --   --  41.2  PLT 328  --   --   --  254  APTT  --  29  --   --   --   LABPROT  --  13.0  --   --   --   INR  --  1.0  --   --   --   HEPARINUNFRC  --   --   --  0.46 0.29*  CREATININE  --   --  1.13  --   --   TROPONINIHS 7  --  8  --   --     Estimated Creatinine Clearance: 80.6 mL/min (by C-G formula based on SCr of 1.13 mg/dL).   Medical History: Past Medical History:  Diagnosis Date  . HLD (hyperlipidemia)   . Hypertension     Medications:  No PTA anticoagulation of record  Assessment: 69 yo male sent for [facility] for abnormal EKG - lightheaded and SOB, troponin's pending.  Pharmacy has been consulted to initiate and monitor a heparin drip.  Goal of Therapy:  Heparin level 0.3-0.7 units/ml Monitor platelets by anticoagulation protocol: Yes   Plan:  Give 5000 units bolus x 1 Start heparin infusion at 1400 units/hr Check anti-Xa level in 6 hours and daily while on heparin Continue to monitor H&H and platelets  Will obtain baseline INR/aPtt per protocol  12/7 @ 1751 HL 0.46, therapeutic x 1.  Will continue Heparin at current rate and recheck HL in am to confirm.  12/8 @ 0509 HL 0.29, subtherapeutic.  Will increase Heparin to 1500 units/hr and recheck HL in 6 hours.  CBC stable.   14/8, PharmD Clinical Pharmacist 06/06/2020 6:12 AM'

## 2020-06-06 NOTE — Progress Notes (Signed)
Progress Note  Patient Name: Peter Hartman Date of Encounter: 06/06/2020  Primary Cardiologist: Yvonne Kendall, MD  Subjective   Aflutter converted to sinus rhythm yesterday evening.  Feels well this AM.  No c/p, dyspnea, palpitations.  Eager to go home.  Inpatient Medications    Scheduled Meds: . amoxicillin-clavulanate  1 tablet Oral Q12H  . fenofibrate  160 mg Oral Daily  . lisinopril  10 mg Oral Daily  . metoprolol tartrate  25 mg Oral BID   Continuous Infusions: . heparin 1,500 Units/hr (06/06/20 0626)   PRN Meds: acetaminophen, albuterol, dextromethorphan-guaiFENesin, hydrALAZINE, metoprolol tartrate, ondansetron (ZOFRAN) IV   Vital Signs    Vitals:   06/05/20 2300 06/06/20 0100 06/06/20 0352 06/06/20 0729  BP:   120/68 117/74  Pulse:   73 66  Resp: 16 15 16 18   Temp:   (!) 97.5 F (36.4 C) 97.9 F (36.6 C)  TempSrc:   Oral   SpO2:   100% 99%  Weight:      Height:        Intake/Output Summary (Last 24 hours) at 06/06/2020 0941 Last data filed at 06/06/2020 14/01/2020 Gross per 24 hour  Intake 245 ml  Output 600 ml  Net -355 ml   Filed Weights   06/05/20 1042  Weight: 118.4 kg    Physical Exam   GEN: Well nourished, well developed, in no acute distress.  HEENT: Grossly normal.  Neck: Supple, no JVD, carotid bruits, or masses. Cardiac: RRR, occas ectopy, no murmurs, rubs, or gallops. No clubbing, cyanosis, edema.  Radials 2+, DP/PT 2+ and equal bilaterally.  Respiratory:  Respirations regular and unlabored, clear to auscultation bilaterally. GI: Obese, soft, nontender, nondistended, BS + x 4. MS: no deformity or atrophy. Skin: warm and dry, no rash. Neuro:  Strength and sensation are intact. Psych: AAOx3.  Normal affect.  Labs    Chemistry Recent Labs  Lab 06/05/20 1238 06/06/20 0509  NA 137 138  K 3.9 3.5  CL 100 103  CO2 27 25  GLUCOSE 108* 103*  BUN 16 17  CREATININE 1.13 0.95  CALCIUM 9.2 8.7*  GFRNONAA >60 >60  ANIONGAP 10 10      Hematology Recent Labs  Lab 06/05/20 1044 06/06/20 0509  WBC 16.2* 9.0  RBC 5.99* 5.08  HGB 15.7 13.4  HCT 48.8 41.2  MCV 81.5 81.1  MCH 26.2 26.4  MCHC 32.2 32.5  RDW 15.2 14.9  PLT 328 254    Cardiac Enzymes  Recent Labs  Lab 06/05/20 1044 06/05/20 1238  TROPONINIHS 7 8     Radiology    DG Chest Portable 1 View  Result Date: 06/05/2020 CLINICAL DATA:  Chest pain EXAM: PORTABLE CHEST 1 VIEW COMPARISON:  10/11/2011 FINDINGS: Normal heart size and mediastinal contours. No acute infiltrate or edema. No effusion or pneumothorax. No acute osseous findings. IMPRESSION: Stable exam.  No evidence of active disease. Electronically Signed   By: 10/13/2011 M.D.   On: 06/05/2020 11:22    Telemetry    Converted to sinus rhythm 12/7@ 1925.  RSR w/ PACs since - Personally Reviewed  Cardiac Studies   2D Echocardiogram 12.7.2021  1. Left ventricular ejection fraction, by estimation, is 55 to 60%. The  left ventricle has normal function. Left ventricular endocardial border  not optimally defined to evaluate regional wall motion. Left ventricular  diastolic parameters are  indeterminate.   2. Right ventricular systolic function is normal. The right ventricular  size is not well  visualized.   3. The mitral valve is degenerative. Trivial mitral valve regurgitation.  No evidence of mitral stenosis.   4. The aortic valve was not well visualized. Aortic valve regurgitation  is not visualized. No aortic stenosis is present.   5. The inferior vena cava is normal in size with greater than 50%  respiratory variability, suggesting right atrial pressure of 3 mmHg.  _____________    Patient Profile     69 y.o. male w/ a h/o HTN, COVID19 infection (08/2019), HTG, obesity, and OSA (noncompliant w/ CPAP), who was admitted 12/7 w/ weakness, lightheadedness, and finding of atrial flutter w/ RVR  converted on oral metoprolol.  Assessment & Plan    1. Atrial flutter w/ RVR:  Presented  w/ a 1 day h/o weakness and lightheadedness  found to be in aflutter w/ rVR @ 150.  Rates improved w/ metoprolol and he converted last evening.  Maintaining sinus rhythm this AM - 60s-70s w/ freq PACs.  Denies any h/o chest pain/DOE.  CHA2DS2VASc = 2. Will d/c heparin and add eliquis 5 BID.  Cont current dose of metoprolol.  He does have a h/o OSA but hasn't been consistently using CPAP.  We discussed the importance of compliance today.  Further, he drinks 3 beers/day.  Discussed need for cessation.  Will arrange for outpt f/u in next few wks.  Sometimes notes DOE.  Echo w/ nl EF and w/o significant valvular dzs.  HsTrop nl x 2.  Can consider outpt stress testing at some point.  2.  Essential HTN:  Stable on  blocker and acei.  3.  HTG:  TG of 260 in Oct.  On fenofibrate.  Of note, LDL 61 @ that time.  4.  OSA:  Noncompliant w/ CPAP.  Discussed importance of compliance in reference to Aflutter.  5.  ETOH abuse:  3 beers/day.  Discussed importance of cessation.  Signed, Nicolasa Ducking, NP  06/06/2020, 9:41 AM    For questions or updates, please contact   Please consult [email or web address] for contact info under Cardiology/STEMI.

## 2020-06-06 NOTE — Discharge Summary (Signed)
Peter Hartman WNU:[id number] DOB: 09-16-1950 DOA: 06/05/2020  PCP: Marisue Ivan, MD  Admit date: 06/05/2020 Discharge date: 06/06/2020  Time spent: 35 minutes  Recommendations for Outpatient Follow-up:  1. Close cardiology and pcp f/u 2. Needs cpap 3. Needs reduction in alcohol consumption     Discharge Diagnoses:  Principal Problem:   Atrial flutter with rapid ventricular response (HCC) Active Problems:   Leukocytosis   Hypertension   HLD (hyperlipidemia)   Discharge Condition: good  Diet recommendation: low sodium heart healthy  Filed Weights   06/05/20 1042  Weight: 118.4 kg    History of present illness:  HPI: Peter Hartman is a 69 y.o. male with medical history significant of hypertension, hyperlipidemia, OSA not use CPAP consistently, who presents with lightheadedness.  Patient states that he has been having lightheadedness since yesterday, no unilateral numbness or tingling in extremities, no facial droop or slurred speech.  He also reports dry cough and had mild shortness of breath earlier which has resolved.  No chest pain.  He states that he has some right ear pain without any drainage.  Denies nausea, vomiting, diarrhea, abdominal pain, symptoms of UTI.   Pt was seen in urgent care today, and found to have new onset atrial flutter with RVR with ventricular rates in the 140s, EKG showed some question of possible ST elevation, not meeting stemi criteria. He was sent to ED for further evaluation and treatment. In the ED he was given IV Lopressor 5 mg x2with improvement in ventricular rates to 90s-100s.   ED Course: pt was found to have WBC 16.2, negative Covid PCR, troponin level 7, 8, INR 1.0, electrolytes renal function okay, GFR> 60, temperature normal, blood pressure 133/76, RR 24, 16, oxygen saturation 97% on room air.  Chest x-ray negative.  Patient is placed on progressive bed for observation cardiology, Dr. Okey Dupre is consulted.   Hospital Course:   Atrial flutter with rapid ventricular response [facility]): Potentially present for at least several months. TSH wnl. troponins wnl and flat. chads2vasc 2. tte unremarkable. No s/s PE. Now in sinus rhythm, regular rate with initiation of BB - cont metoprolol tart 25 bid, transition to succinate 50 qd - continue eliquis  Hypertension BP wnl today -IV hydralazine as needed -Continue home lisinopril -Hold HCTZ from home; metoprolol started as above  HLD (hyperlipidemia) -Continue home fenofibrate  Heavy drinker At least 3 drinks/day - discussed role in a flutter, counseled to cut down  Obstructive sleep apnea Pt says diagnosed but not on cpap - discussed role in a flutter, counseled PCP f/u to initiate CPAP  Procedures:  tte   Consultations:  cardiology  Discharge Exam: Vitals:   06/06/20 0729 06/06/20 1155  BP: 117/74 (!) 137/96  Pulse: 66 68  Resp: 18 18  Temp: 97.9 F (36.6 C) 97.7 F (36.5 C)  SpO2: 99% 99%    General exam: Appears calm and comfortable  Respiratory system: Clear to auscultation. Respiratory effort normal. Cardiovascular system: S1 & S2 heard, RRR. No JVD, murmurs, rubs, gallops or clicks. No pedal edema. Gastrointestinal system: Abdomen is nondistended, soft and nontender. No organomegaly or masses felt. Normal bowel sounds heard. Central nervous system: Alert and oriented. No focal neurological deficits. Extremities: Symmetric 5 x 5 power. Skin: No rashes, lesions or ulcers Psychiatry: Judgement and insight appear normal. Mood & affect appropriate.   Discharge Instructions   Discharge Instructions    Diet - low sodium heart healthy   Complete by: As directed  Increase activity slowly   Complete by: As directed      Allergies as of 06/06/2020   No Known Allergies     Medication List    STOP taking these medications   hydrochlorothiazide 25 MG tablet Commonly known as: HYDRODIURIL     TAKE these medications   apixaban 5 MG Tabs  tablet Commonly known as: ELIQUIS Take 1 tablet (5 mg total) by mouth 2 (two) times daily.   fenofibrate micronized 200 MG capsule Commonly known as: LOFIBRA Take 200 mg by mouth daily.   lisinopril 10 MG tablet Commonly known as: ZESTRIL Take 10 mg by mouth daily.   metoprolol succinate 50 MG 24 hr tablet Commonly known as: Toprol XL Take 1 tablet (50 mg total) by mouth daily. Take with or immediately following a meal.   sildenafil 50 MG tablet Commonly known as: VIAGRA Take 50 mg by mouth at bedtime.      No Known Allergies  Follow-up Information    End, Cristal Deer, MD On 07/03/2020.   Specialty: Cardiology Why: @ 10am Contact information: [address] [place] [address] [phone]        Marisue Ivan, MD. Schedule an appointment as soon as possible for a visit.   Specialty: Family Medicine Contact information: [address] [facility] [place] [address] [phone]                The results of significant diagnostics from this hospitalization (including imaging, microbiology, ancillary and laboratory) are listed below for reference.    Significant Diagnostic Studies: DG Chest Portable 1 View  Result Date: 06/05/2020 CLINICAL DATA:  Chest pain EXAM: PORTABLE CHEST 1 VIEW COMPARISON:  10/11/2011 FINDINGS: Normal heart size and mediastinal contours. No acute infiltrate or edema. No effusion or pneumothorax. No acute osseous findings. IMPRESSION: Stable exam.  No evidence of active disease. Electronically Signed   By: Marnee Spring M.D.   On: 06/05/2020 11:22   ECHOCARDIOGRAM COMPLETE  Result Date: 06/05/2020    ECHOCARDIOGRAM REPORT   Patient Name:   Peter Hartman Date of Exam: 06/05/2020 Medical Rec #:  [id number]       Height:       69.0 in Accession #:    [id number]      Weight:       261.0 lb Date of Birth:  [birth year]        BSA:          2.313 m Patient Age:    69 years        BP:           134/80 mmHg  Patient Gender: M               HR:           74 bpm. Exam Location:  [facility] Procedure: 2D Echo, Color Doppler, Cardiac Doppler and Intracardiac            Opacification Agent Indications:     I48.92 Atrial Flutter  History:         Patient has no prior history of Echocardiogram examinations.                  Risk Factors:Hypertension and Dyslipidemia.  Sonographer:     Humphrey Rolls RDCS (AE) Referring Phys:  Kern Reap Lorretta Harp Diagnosing Phys: Yvonne Kendall MD  Sonographer Comments: Technically difficult study due to poor echo windows. Image acquisition challenging due to patient body habitus. IMPRESSIONS  1. Left ventricular ejection fraction, by estimation, is 55 to 60%. The left ventricle has normal function. Left ventricular endocardial border not optimally defined to evaluate regional wall motion. Left ventricular diastolic parameters are indeterminate.  2. Right ventricular systolic function is normal. The right ventricular size is not well visualized.  3. The mitral valve is degenerative. Trivial mitral valve regurgitation. No evidence of mitral stenosis.  4. The aortic valve was not well visualized. Aortic valve regurgitation is not visualized. No aortic stenosis is present.  5. The inferior vena cava is normal in size with greater than 50% respiratory variability, suggesting right atrial pressure of 3 mmHg. FINDINGS  Left Ventricle: Left ventricular ejection fraction, by estimation, is 55 to 60%. The left ventricle has normal function. Left ventricular endocardial border not optimally defined to evaluate regional wall motion. Definity contrast agent was given IV to delineate the left ventricular endocardial borders. The left ventricular internal cavity size was normal in size. There is no left ventricular hypertrophy. Left ventricular diastolic parameters are indeterminate. Right Ventricle: The right ventricular size is not well visualized. No increase in right ventricular wall thickness. Right ventricular  systolic function is normal. Left Atrium: Left atrial size was normal in size. Right Atrium: Right atrial size was not well visualized. Pericardium: There is no evidence of pericardial effusion. Mitral Valve: The mitral valve is degenerative in appearance. There is mild thickening of the mitral valve leaflet(s). There is mild calcification of the mitral valve leaflet(s). Mild mitral annular calcification. Trivial mitral valve regurgitation. No evidence of mitral valve stenosis. MV peak gradient, 3.6 mmHg. The mean mitral valve gradient is 2.0 mmHg. Tricuspid Valve: The tricuspid valve is not well visualized. Tricuspid valve regurgitation is not demonstrated. Aortic Valve: The aortic valve was not well visualized. Aortic valve regurgitation is not visualized. No aortic stenosis is present. Aortic valve mean gradient measures 1.0 mmHg. Aortic valve peak gradient measures 2.7 mmHg. Aortic valve area, by VTI measures 3.71 cm. Pulmonic Valve: The pulmonic valve was not well visualized. Pulmonic valve regurgitation is not visualized. No evidence of pulmonic stenosis. Aorta: The aortic root is normal in size and structure. Pulmonary Artery: The pulmonary artery is not well seen. Venous: The inferior vena cava is normal in size with greater than 50% respiratory variability, suggesting right atrial pressure of 3 mmHg. IAS/Shunts: The interatrial septum was not well visualized.  LEFT VENTRICLE PLAX 2D LVIDd:         4.85 cm  Diastology LVIDs:         3.27 cm  LV e' medial:    11.20 cm/s LV PW:         0.82 cm  LV E/e' medial:  8.0 LV IVS:        0.86 cm  LV e' lateral:   19.30 cm/s LVOT diam:     2.30 cm  LV E/e' lateral: 4.6 LV SV:         52 LV SV Index:   23 LVOT Area:     4.15 cm  LEFT ATRIUM           Index LA diam:      3.10 cm 1.34 cm/m LA Vol (A4C): 42.3 ml 18.28 ml/m  AORTIC VALVE                   PULMONIC VALVE AV Area (Vmax):    3.65 cm    PV Vmax:       0.72 m/s AV Area (Vmean):  3.59 cm    PV Vmean:       49.900 cm/s AV Area (VTI):     3.71 cm    PV VTI:        0.132 m AV Vmax:           81.80 cm/s  PV Peak grad:  2.0 mmHg AV Vmean:          55.800 cm/s PV Mean grad:  1.0 mmHg AV VTI:            0.141 m AV Peak Grad:      2.7 mmHg AV Mean Grad:      1.0 mmHg LVOT Vmax:         71.80 cm/s LVOT Vmean:        48.200 cm/s LVOT VTI:          0.126 m LVOT/AV VTI ratio: 0.89  AORTA Ao Root diam: 3.40 cm MITRAL VALVE MV Area (PHT): 5.23 cm    SHUNTS MV Peak grad:  3.6 mmHg    Systemic VTI:  0.13 m MV Mean grad:  2.0 mmHg    Systemic Diam: 2.30 cm MV Vmax:       0.95 m/s MV Vmean:      70.5 cm/s MV Decel Time: 145 msec MV E velocity: 89.10 cm/s MV A velocity: 60.00 cm/s MV E/A ratio:  1.48 Cristal Deer End MD Electronically signed by Yvonne Kendall MD Signature Date/Time: 06/05/2020/6:32:45 PM    Final     Microbiology: Recent Results (from the past 240 hour(s))  Resp Panel by RT-PCR (Flu A&B, Covid) Nasopharyngeal Swab     Status: None   Collection Time: 06/05/20 10:54 AM   Specimen: Nasopharyngeal Swab; Nasopharyngeal(NP) swabs in vial transport medium  Result Value Ref Range Status   SARS Coronavirus 2 by RT PCR NEGATIVE NEGATIVE Final    Comment: (NOTE) SARS-CoV-2 target nucleic acids are NOT DETECTED.  The SARS-CoV-2 RNA is generally detectable in upper respiratory specimens during the acute phase of infection. The lowest concentration of SARS-CoV-2 viral copies this assay can detect is 138 copies/mL. A negative result does not preclude SARS-Cov-2 infection and should not be used as the sole basis for treatment or other patient management decisions. A negative result may occur with  improper specimen collection/handling, submission of specimen other than nasopharyngeal swab, presence of viral mutation(s) within the areas targeted by this assay, and inadequate number of viral copies(<138 copies/mL). A negative result must be combined with clinical observations, patient history, and  epidemiological information. The expected result is Negative.  Fact Sheet for Patients:  BloggerCourse.com  Fact Sheet for Healthcare Providers:  SeriousBroker.it  This test is no t yet approved or cleared by the [place] FDA and  has been authorized for detection and/or diagnosis of SARS-CoV-2 by FDA under an Emergency Use Authorization (EUA). This EUA will remain  in effect (meaning this test can be used) for the duration of the COVID-19 declaration under Section 564(b)(1) of the Act, 21 U.S.C.section 360bbb-3(b)(1), unless the authorization is terminated  or revoked sooner.       Influenza A by PCR NEGATIVE NEGATIVE Final   Influenza B by PCR NEGATIVE NEGATIVE Final    Comment: (NOTE) The Xpert Xpress SARS-CoV-2/FLU/RSV plus assay is intended as an aid in the diagnosis of influenza from Nasopharyngeal swab specimens and should not be used as a sole basis for treatment. Nasal washings and aspirates are unacceptable for Xpert Xpress SARS-CoV-2/FLU/RSV testing.  Fact Sheet for Patients:  BloggerCourse.com  Fact Sheet for Healthcare Providers: SeriousBroker.it  This test is not yet approved or cleared by the [place] FDA and has been authorized for detection and/or diagnosis of SARS-CoV-2 by FDA under an Emergency Use Authorization (EUA). This EUA will remain in effect (meaning this test can be used) for the duration of the COVID-19 declaration under Section 564(b)(1) of the Act, 21 U.S.C. section 360bbb-3(b)(1), unless the authorization is terminated or revoked.  Performed at [facility], [address] [place], [place] [address]   Culture, blood (Routine X 2) w Reflex to ID Panel     Status: None (Preliminary result)   Collection Time: 06/05/20  5:51 PM   Specimen: BLOOD  Result Value Ref Range Status   Specimen Description BLOOD BLOOD LEFT FOREARM   Final   Special Requests   Final    BOTTLES DRAWN AEROBIC AND ANAEROBIC Blood Culture adequate volume   Culture   Final    NO GROWTH < 24 HOURS Performed at [facility], [address] [place], [place] [address]    Report Status PENDING  Incomplete  Culture, blood (Routine X 2) w Reflex to ID Panel     Status: None (Preliminary result)   Collection Time: 06/05/20  5:51 PM   Specimen: BLOOD  Result Value Ref Range Status   Specimen Description BLOOD RIGHT ANTECUBITAL  Final   Special Requests   Final    BOTTLES DRAWN AEROBIC AND ANAEROBIC Blood Culture adequate volume   Culture   Final    NO GROWTH < 24 HOURS Performed at [facility], [address], [place], [place] [address]    Report Status PENDING  Incomplete     Labs: Basic Metabolic Panel: Recent Labs  Lab 06/05/20 1044 06/05/20 1238 06/06/20 0509  NA  --  137 138  K  --  3.9 3.5  CL  --  100 103  CO2  --  27 25  GLUCOSE  --  108* 103*  BUN  --  16 17  CREATININE  --  1.13 0.95  CALCIUM  --  9.2 8.7*  MG 2.1  --   --    Liver Function Tests: No results for input(s): AST, ALT, ALKPHOS, BILITOT, PROT, ALBUMIN in the last 168 hours. No results for input(s): LIPASE, AMYLASE in the last 168 hours. No results for input(s): AMMONIA in the last 168 hours. CBC: Recent Labs  Lab 06/05/20 1044 06/06/20 0509  WBC 16.2* 9.0  NEUTROABS 10.8*  --   HGB 15.7 13.4  HCT 48.8 41.2  MCV 81.5 81.1  PLT 328 254   Cardiac Enzymes: No results for input(s): CKTOTAL, CKMB, CKMBINDEX, TROPONINI in the last 168 hours. BNP: BNP (last 3 results) No results for input(s): BNP in the last 8760 hours.  ProBNP (last 3 results) No results for input(s): PROBNP in the last 8760 hours.  CBG: No results for input(s): GLUCAP in the last 168 hours.     Signed:  Silvano Bilis MD.  [facility] 06/06/2020, 3:33 PM

## 2020-06-06 NOTE — Progress Notes (Signed)
PROGRESS NOTE    Peter Hartman  KDT:[id number] DOB: 02/09/1951 DOA: 06/05/2020 PCP: Marisue Ivan, MD  Outpatient Specialists: none    Brief Narrative:   Peter Hartman is a 69 y.o. male with medical history significant of hypertension, hyperlipidemia, OSA not use CPAP consistently, who presents with lightheadedness.  Patient states that he has been having lightheadedness since yesterday, no unilateral numbness or tingling in extremities, no facial droop or slurred speech.  He also reports dry cough and had mild shortness of breath earlier which has resolved.  No chest pain.  He states that he has some right ear pain without any drainage.  Denies nausea, vomiting, diarrhea, abdominal pain, symptoms of UTI.   Pt was seen in urgent care today, and found to have new onset atrial flutter with RVR with ventricular rates in the 140s, EKG showed some question of possible ST elevation, not meeting stemi criteria. He was sent to ED for further evaluation and treatment. In the ED he was given IV Lopressor 5 mg x2with improvement in ventricular rates to 90s-100s.   ED Course: pt was found to have WBC 16.2, negative Covid PCR, troponin level 7, 8, INR 1.0, electrolytes renal function okay, GFR> 60, temperature normal, blood pressure 133/76, RR 24, 16, oxygen saturation 97% on room air.  Chest x-ray negative.  Patient is placed on progressive bed for observation cardiology, Dr. Okey Dupre is consulted.   Assessment & Plan:   Principal Problem:   Atrial flutter with rapid ventricular response (HCC) Active Problems:   Leukocytosis   Hypertension   HLD (hyperlipidemia)  Atrial flutter with rapid ventricular response (HCC): Potentially present for at least several months. TSH wnl. troponins wnl and flat. chads2vasc 2. tte unremarkable. No s/s PE. Now in sinus rhythm, regular rate with initiation of BB - cont metoprolol tart 25 bid - cont heparin, defer initiation and choice of doac to them -  continue tele - possible d/c later today  Hypertension BP wnl today -IV hydralazine as needed -Continue home lisinopril -Hold HCTZ from home; metoprolol started as above  HLD (hyperlipidemia) -Continue home fenofibrate  Heavy drinker At least 3 drinks/day - discussed role in a flutter, counseled to cut down  Obstructive sleep apnea Pt says diagnosed but not on cpap - discussed role in a flutter, counseled PCP f/u to initiate CPAP   DVT prophylaxis: heparin Code Status: full Family Communication: none @ bedside  Status is: Observation  The patient remains OBS appropriate and will d/c before 2 midnights.  Dispo: The patient is from: Home              Anticipated d/c is to: Home              Anticipated d/c date is: 0-1 days              Patient currently is not medically stable to d/c.        Consultants:  cardiology  Procedures: TTE  Antimicrobials:  none    Subjective: This morning feeling well, no sob, chest pain, chest pressure, palpitations. Wants to go home. Has appetite. No bleeding  Objective: Vitals:   06/05/20 2100 06/05/20 2300 06/06/20 0100 06/06/20 0352  BP:    120/68  Pulse:    73  Resp: 18 16 15 16   Temp:    (!) 97.5 F (36.4 C)  TempSrc:    Oral  SpO2:    100%  Weight:      Height:  Intake/Output Summary (Last 24 hours) at 06/06/2020 0726 Last data filed at 06/06/2020 0626 Gross per 24 hour  Intake 245 ml  Output 600 ml  Net -355 ml   Filed Weights   06/05/20 1042  Weight: 118.4 kg    Examination:  General exam: Appears calm and comfortable  Respiratory system: Clear to auscultation. Respiratory effort normal. Cardiovascular system: S1 & S2 heard, RRR. No JVD, murmurs, rubs, gallops or clicks. No pedal edema. Gastrointestinal system: Abdomen is nondistended, soft and nontender. No organomegaly or masses felt. Normal bowel sounds heard. Central nervous system: Alert and oriented. No focal neurological  deficits. Extremities: Symmetric 5 x 5 power. Skin: No rashes, lesions or ulcers Psychiatry: Judgement and insight appear normal. Mood & affect appropriate.     Data Reviewed: I have personally reviewed following labs and imaging studies  CBC: Recent Labs  Lab 06/05/20 1044 06/06/20 0509  WBC 16.2* 9.0  NEUTROABS 10.8*  --   HGB 15.7 13.4  HCT 48.8 41.2  MCV 81.5 81.1  PLT 328 254   Basic Metabolic Panel: Recent Labs  Lab 06/05/20 1044 06/05/20 1238 06/06/20 0509  NA  --  137 138  K  --  3.9 3.5  CL  --  100 103  CO2  --  27 25  GLUCOSE  --  108* 103*  BUN  --  16 17  CREATININE  --  1.13 0.95  CALCIUM  --  9.2 8.7*  MG 2.1  --   --    GFR: Estimated Creatinine Clearance: 95.8 mL/min (by C-G formula based on SCr of 0.95 mg/dL). Liver Function Tests: No results for input(s): AST, ALT, ALKPHOS, BILITOT, PROT, ALBUMIN in the last 168 hours. No results for input(s): LIPASE, AMYLASE in the last 168 hours. No results for input(s): AMMONIA in the last 168 hours. Coagulation Profile: Recent Labs  Lab 06/05/20 1131  INR 1.0   Cardiac Enzymes: No results for input(s): CKTOTAL, CKMB, CKMBINDEX, TROPONINI in the last 168 hours. BNP (last 3 results) No results for input(s): PROBNP in the last 8760 hours. HbA1C: No results for input(s): HGBA1C in the last 72 hours. CBG: No results for input(s): GLUCAP in the last 168 hours. Lipid Profile: No results for input(s): CHOL, HDL, LDLCALC, TRIG, CHOLHDL, LDLDIRECT in the last 72 hours. Thyroid Function Tests: Recent Labs    06/05/20 1044  TSH 2.133   Anemia Panel: No results for input(s): VITAMINB12, FOLATE, FERRITIN, TIBC, IRON, RETICCTPCT in the last 72 hours. Urine analysis: No results found for: COLORURINE, APPEARANCEUR, LABSPEC, PHURINE, GLUCOSEU, HGBUR, BILIRUBINUR, KETONESUR, PROTEINUR, UROBILINOGEN, NITRITE, LEUKOCYTESUR Sepsis Labs: @LABRCNTIP (procalcitonin:4,lacticidven:4)  ) Recent Results (from the past  240 hour(s))  Resp Panel by RT-PCR (Flu A&B, Covid) Nasopharyngeal Swab     Status: None   Collection Time: 06/05/20 10:54 AM   Specimen: Nasopharyngeal Swab; Nasopharyngeal(NP) swabs in vial transport medium  Result Value Ref Range Status   SARS Coronavirus 2 by RT PCR NEGATIVE NEGATIVE Final    Comment: (NOTE) SARS-CoV-2 target nucleic acids are NOT DETECTED.  The SARS-CoV-2 RNA is generally detectable in upper respiratory specimens during the acute phase of infection. The lowest concentration of SARS-CoV-2 viral copies this assay can detect is 138 copies/mL. A negative result does not preclude SARS-Cov-2 infection and should not be used as the sole basis for treatment or other patient management decisions. A negative result may occur with  improper specimen collection/handling, submission of specimen other than nasopharyngeal swab, presence of viral mutation(s) within  the areas targeted by this assay, and inadequate number of viral copies(<138 copies/mL). A negative result must be combined with clinical observations, patient history, and epidemiological information. The expected result is Negative.  Fact Sheet for Patients:  BloggerCourse.com  Fact Sheet for Healthcare Providers:  SeriousBroker.it  This test is no t yet approved or cleared by the [place] FDA and  has been authorized for detection and/or diagnosis of SARS-CoV-2 by FDA under an Emergency Use Authorization (EUA). This EUA will remain  in effect (meaning this test can be used) for the duration of the COVID-19 declaration under Section 564(b)(1) of the Act, 21 U.S.C.section 360bbb-3(b)(1), unless the authorization is terminated  or revoked sooner.       Influenza A by PCR NEGATIVE NEGATIVE Final   Influenza B by PCR NEGATIVE NEGATIVE Final    Comment: (NOTE) The Xpert Xpress SARS-CoV-2/FLU/RSV plus assay is intended as an aid in the diagnosis of influenza  from Nasopharyngeal swab specimens and should not be used as a sole basis for treatment. Nasal washings and aspirates are unacceptable for Xpert Xpress SARS-CoV-2/FLU/RSV testing.  Fact Sheet for Patients: BloggerCourse.com  Fact Sheet for Healthcare Providers: SeriousBroker.it  This test is not yet approved or cleared by the [place] FDA and has been authorized for detection and/or diagnosis of SARS-CoV-2 by FDA under an Emergency Use Authorization (EUA). This EUA will remain in effect (meaning this test can be used) for the duration of the COVID-19 declaration under Section 564(b)(1) of the Act, 21 U.S.C. section 360bbb-3(b)(1), unless the authorization is terminated or revoked.  Performed at [facility], [address] [place], [place] [address]   Culture, blood (Routine X 2) w Reflex to ID Panel     Status: None (Preliminary result)   Collection Time: 06/05/20  5:51 PM   Specimen: BLOOD  Result Value Ref Range Status   Specimen Description BLOOD BLOOD LEFT FOREARM  Final   Special Requests   Final    BOTTLES DRAWN AEROBIC AND ANAEROBIC Blood Culture adequate volume   Culture   Final    NO GROWTH < 24 HOURS Performed at [facility], [address], [place], [place] [address]    Report Status PENDING  Incomplete  Culture, blood (Routine X 2) w Reflex to ID Panel     Status: None (Preliminary result)   Collection Time: 06/05/20  5:51 PM   Specimen: BLOOD  Result Value Ref Range Status   Specimen Description BLOOD RIGHT ANTECUBITAL  Final   Special Requests   Final    BOTTLES DRAWN AEROBIC AND ANAEROBIC Blood Culture adequate volume   Culture   Final    NO GROWTH < 24 HOURS Performed at [facility], [address] [place], [place] [address]    Report Status PENDING  Incomplete         Radiology Studies: DG Chest Portable 1 View  Result Date: 06/05/2020 CLINICAL DATA:  Chest  pain EXAM: PORTABLE CHEST 1 VIEW COMPARISON:  10/11/2011 FINDINGS: Normal heart size and mediastinal contours. No acute infiltrate or edema. No effusion or pneumothorax. No acute osseous findings. IMPRESSION: Stable exam.  No evidence of active disease. Electronically Signed   By: Marnee Spring M.D.   On: 06/05/2020 11:22   ECHOCARDIOGRAM COMPLETE  Result Date: 06/05/2020    ECHOCARDIOGRAM REPORT   Patient Name:   CONNIE HILGERT Date of Exam: 06/05/2020 Medical Rec #:  [id number]       Height:  69.0 in Accession #:    [id number]      Weight:       261.0 lb Date of Birth:  08/14/1950        BSA:          2.313 m Patient Age:    69 years        BP:           134/80 mmHg Patient Gender: M               HR:           74 bpm. Exam Location:  [facility] Procedure: 2D Echo, Color Doppler, Cardiac Doppler and Intracardiac            Opacification Agent Indications:     I48.92 Atrial Flutter  History:         Patient has no prior history of Echocardiogram examinations.                  Risk Factors:Hypertension and Dyslipidemia.  Sonographer:     Humphrey Rolls RDCS (AE) Referring Phys:  Kern Reap Lorretta Harp Diagnosing Phys: Yvonne Kendall MD  Sonographer Comments: Technically difficult study due to poor echo windows. Image acquisition challenging due to patient body habitus. IMPRESSIONS  1. Left ventricular ejection fraction, by estimation, is 55 to 60%. The left ventricle has normal function. Left ventricular endocardial border not optimally defined to evaluate regional wall motion. Left ventricular diastolic parameters are indeterminate.  2. Right ventricular systolic function is normal. The right ventricular size is not well visualized.  3. The mitral valve is degenerative. Trivial mitral valve regurgitation. No evidence of mitral stenosis.  4. The aortic valve was not well visualized. Aortic valve regurgitation is not visualized. No aortic stenosis is present.  5. The inferior vena cava is normal in size with greater than 50%  respiratory variability, suggesting right atrial pressure of 3 mmHg. FINDINGS  Left Ventricle: Left ventricular ejection fraction, by estimation, is 55 to 60%. The left ventricle has normal function. Left ventricular endocardial border not optimally defined to evaluate regional wall motion. Definity contrast agent was given IV to delineate the left ventricular endocardial borders. The left ventricular internal cavity size was normal in size. There is no left ventricular hypertrophy. Left ventricular diastolic parameters are indeterminate. Right Ventricle: The right ventricular size is not well visualized. No increase in right ventricular wall thickness. Right ventricular systolic function is normal. Left Atrium: Left atrial size was normal in size. Right Atrium: Right atrial size was not well visualized. Pericardium: There is no evidence of pericardial effusion. Mitral Valve: The mitral valve is degenerative in appearance. There is mild thickening of the mitral valve leaflet(s). There is mild calcification of the mitral valve leaflet(s). Mild mitral annular calcification. Trivial mitral valve regurgitation. No evidence of mitral valve stenosis. MV peak gradient, 3.6 mmHg. The mean mitral valve gradient is 2.0 mmHg. Tricuspid Valve: The tricuspid valve is not well visualized. Tricuspid valve regurgitation is not demonstrated. Aortic Valve: The aortic valve was not well visualized. Aortic valve regurgitation is not visualized. No aortic stenosis is present. Aortic valve mean gradient measures 1.0 mmHg. Aortic valve peak gradient measures 2.7 mmHg. Aortic valve area, by VTI measures 3.71 cm. Pulmonic Valve: The pulmonic valve was not well visualized. Pulmonic valve regurgitation is not visualized. No evidence of pulmonic stenosis. Aorta: The aortic root is normal in size and structure. Pulmonary Artery: The pulmonary artery is not well seen. Venous: The inferior vena cava  is normal in size with greater than 50%  respiratory variability, suggesting right atrial pressure of 3 mmHg. IAS/Shunts: The interatrial septum was not well visualized.  LEFT VENTRICLE PLAX 2D LVIDd:         4.85 cm  Diastology LVIDs:         3.27 cm  LV e' medial:    11.20 cm/s LV PW:         0.82 cm  LV E/e' medial:  8.0 LV IVS:        0.86 cm  LV e' lateral:   19.30 cm/s LVOT diam:     2.30 cm  LV E/e' lateral: 4.6 LV SV:         52 LV SV Index:   23 LVOT Area:     4.15 cm  LEFT ATRIUM           Index LA diam:      3.10 cm 1.34 cm/m LA Vol (A4C): 42.3 ml 18.28 ml/m  AORTIC VALVE                   PULMONIC VALVE AV Area (Vmax):    3.65 cm    PV Vmax:       0.72 m/s AV Area (Vmean):   3.59 cm    PV Vmean:      49.900 cm/s AV Area (VTI):     3.71 cm    PV VTI:        0.132 m AV Vmax:           81.80 cm/s  PV Peak grad:  2.0 mmHg AV Vmean:          55.800 cm/s PV Mean grad:  1.0 mmHg AV VTI:            0.141 m AV Peak Grad:      2.7 mmHg AV Mean Grad:      1.0 mmHg LVOT Vmax:         71.80 cm/s LVOT Vmean:        48.200 cm/s LVOT VTI:          0.126 m LVOT/AV VTI ratio: 0.89  AORTA Ao Root diam: 3.40 cm MITRAL VALVE MV Area (PHT): 5.23 cm    SHUNTS MV Peak grad:  3.6 mmHg    Systemic VTI:  0.13 m MV Mean grad:  2.0 mmHg    Systemic Diam: 2.30 cm MV Vmax:       0.95 m/s MV Vmean:      70.5 cm/s MV Decel Time: 145 msec MV E velocity: 89.10 cm/s MV A velocity: 60.00 cm/s MV E/A ratio:  1.48 Cristal Deer End MD Electronically signed by Yvonne Kendall MD Signature Date/Time: 06/05/2020/6:32:45 PM    Final         Scheduled Meds: . amoxicillin-clavulanate  1 tablet Oral Q12H  . fenofibrate  160 mg Oral Daily  . lisinopril  10 mg Oral Daily  . metoprolol tartrate  25 mg Oral BID   Continuous Infusions: . heparin 1,500 Units/hr (06/06/20 0626)     LOS: 0 days    Time spent: 30 min    Peter Bilis, MD [facility]   If 7PM-7AM, please contact night-coverage [email or web address] Password [facility] 06/06/2020, 7:26 AM

## 2020-06-06 NOTE — Discharge Instructions (Signed)
Atrial Flutter  Atrial flutter is a type of abnormal heart rhythm (arrhythmia). The heart has an electrical system that tells it how to beat. In atrial flutter, the signals move rapidly in the top chambers of the heart (the atria). This makes your heart beat very fast. Atrial flutter can come and go, or it can be permanent. The goal of treatment is to prevent blood clots from forming, control your heart rate, or restore your heartbeat to a normal rhythm. If this condition is not treated, it can cause serious problems, such as a weakened heart muscle (cardiomyopathy) or a stroke. What are the causes? This condition is often caused by conditions that damage the heart's electrical system. These include:  Heart conditions and heart surgery. These include heart attacks and open-heart surgery.  Lung problems, such as COPD or a blood clot in the lung (pulmonary embolism, or PE).  Poorly controlled high blood pressure (hypertension).  Overactive thyroid (hyperthyroidism).  Diabetes. In some cases, the cause of this condition is not known. What increases the risk? You are more likely to develop this condition if:  You are an elderly adult.  You are a man.  You are overweight (obese).  You have obstructive sleep apnea.  You have a family history of atrial flutter.  You have diabetes.  You drink a lot of alcohol, especially binge drinking.  You use drugs, including cannabis.  You smoke. What are the signs or symptoms? Symptoms of this condition include:  A feeling that your heart is pounding or racing (palpitations).  Shortness of breath.  Chest pain.  Feeling dizzy or light-headed.  Fainting.  Low blood pressure (hypotension).  Fatigue.  Tiring easily during exercise or activity. In some cases, there are no symptoms. How is this diagnosed? This condition may be diagnosed with:  An electrocardiogram (ECG) to check electrical signals of the heart.  An ambulatory  cardiac monitor to record your heart's activity for a few days.  An echocardiogram to create pictures of your heart.  A transesophageal echocardiogram (TEE) to create even better pictures of your heart.  A stress test to check your blood supply while you exercise.  Imaging tests, such as a CT scan or chest X-ray.  Blood tests. How is this treated? Treatment depends on underlying conditions and how you feel when you experience atrial flutter. This condition may be treated with:  Medicines to prevent blood clots or to treat heart rate or heart rhythm problems.  Electrical cardioversion to reset the heart's rhythm.  Ablation to remove the heart tissue that sends abnormal signals.  Left atrial appendage closure to seal the area where blood clots can form. In some cases, underlying conditions will be treated. Follow these instructions at home: Medicines  Take over-the-counter and prescription medicines only as told by your health care provider.  Do not take any new medicines without talking to your health care provider.  If you are taking blood thinners: ? Talk with your health care provider before you take any medicines that contain aspirin or NSAIDs, such as ibuprofen. These medicines increase your risk for dangerous bleeding. ? Take your medicine exactly as told, at the same time every day. ? Avoid activities that could cause injury or bruising, and follow instructions about how to prevent falls. ? Wear a medical alert bracelet or carry a card that lists what medicines you take. Lifestyle  Eat heart-healthy foods. Talk with a dietitian to make an eating plan that is right for you.  Do   not use any products that contain nicotine or tobacco, such as cigarettes, e-cigarettes, and chewing tobacco. If you need help quitting, ask your health care provider.  Do not drink alcohol.  Do not use drugs, including cannabis.  Lose weight if you are overweight or obese.  Exercise  regularly as instructed by your health care provider. General instructions  Do not use diet pills unless your health care provider approves. Diet pills may make heart problems worse.  If you have obstructive sleep apnea, manage your condition as told by your health care provider.  Keep all follow-up visits as told by your health care provider. This is important. Contact a health care provider if you:  Notice a change in the rate, rhythm, or strength of your heartbeat.  Are taking a blood thinner and you notice more bruising.  Have a sudden change in weight.  Tire more easily when you exercise or do heavy work. Get help right away if you have:  Pain or pressure in your chest.  Shortness of breath.  Fainting.  Increasing sweating with no known cause.  Side effects of blood thinners, such as blood in your vomit, stool, or urine, or bleeding that cannot stop.  Any symptoms of a stroke. "BE FAST" is an easy way to remember the main warning signs of a stroke: ? B - Balance. Signs are dizziness, sudden trouble walking, or loss of balance. ? E - Eyes. Signs are trouble seeing or a sudden change in vision. ? F - Face. Signs are sudden weakness or numbness of the face, or the face or eyelid drooping on one side. ? A - Arms. Signs are weakness or numbness in an arm. This happens suddenly and usually on one side of the body. ? S - Speech. Signs are sudden trouble speaking, slurred speech, or trouble understanding what people say. ? T - Time. Time to call emergency services. Write down what time symptoms started.  Other signs of a stroke, such as: ? A sudden, severe headache with no known cause. ? Nausea or vomiting. ? Seizure.  These symptoms may represent a serious problem that is an emergency. Do not wait to see if the symptoms will go away. Get medical help right away. Call your local emergency services (911 in the [place] Do not drive yourself to the hospital. Summary  Atrial  flutter is an abnormal heart rhythm that can give you symptoms of palpitations, shortness of breath, or fatigue.  Atrial flutter is often treated with medicines to keep your heart in a normal rhythm and to prevent a stroke.  Get help right away if you cannot catch your breath, or have chest pain or pressure.  Get help right away if you have signs or symptoms of a stroke. This information is not intended to replace advice given to you by your health care provider. Make sure you discuss any questions you have with your health care provider. Document Revised: 12/08/2018 Document Reviewed: 12/08/2018 [employer]vier Patient Education  2020 [employer]  

## 2020-06-10 LAB — CULTURE, BLOOD (ROUTINE X 2)
Culture: NO GROWTH
Culture: NO GROWTH
Special Requests: ADEQUATE
Special Requests: ADEQUATE

## 2020-06-11 DIAGNOSIS — Z8679 Personal history of other diseases of the circulatory system: Secondary | ICD-10-CM | POA: Diagnosis not present

## 2020-06-25 NOTE — Progress Notes (Deleted)
Cardiology Office Note    Date:  06/25/2020   ID:  Peter Hartman, DOB 1950/07/02, MRN [id number]  PCP:  Marisue Ivan, MD  Cardiologist:  Yvonne Kendall, MD  Electrophysiologist:  None   Chief Complaint: Hospital follow-up  History of Present Illness:   Peter Hartman is a 69 y.o. male with history of recently diagnosed atrial flutter in early 05/2020 on Eliquis, Covid infection in 08/2019 not requiring hospital admission, HTN, prior tobacco use at 0.3 packs/day for 30 years quitting in 2017, ongoing alcohol use, hypertriglyceridemia, obesity, and OSA not on CPAP who presents for hospital follow-up after recent admission to [facility] from 12/7 through 12/8 for new onset atrial flutter.  Prior to his recent admission in early 05/2020, he did not have any previously known cardiac history.  He did report an episode of palpitations that occurred in approximately 11/2019 without further work-up at that time.  He was admitted to [facility] on 06/05/2020 with dizziness, lightheadedness, and presyncope.  He was found to be in new onset atrial flutter with RVR with ventricular rates in the 140s bpm.  High-sensitivity troponin negative x2.  He was rate controlled with Lopressor with subsequent conversion to sinus rhythm.  Given his CHA2DS2-VASc of at least 2 he was anticoagulated with Eliquis.  Echo showed an EF of 55 to 60%, indeterminate diastolic function parameters, indeterminate wall motion, normal RV systolic function, trivial mitral regurgitation, and a right atrial pressure estimated at 3 mmHg.    ***   Labs independently reviewed: 05/2020 - Hgb 13.4, PLT 254, potassium 3.5, BUN 17, serum creatinine 0.95, TSH normal, magnesium 2.1 03/2020 - TC 179, TG 260, HDL 66, LDL 61, albumin 4.0, AST/ALT normal 09/2019 - A1c 5.6  Past Medical History:  Diagnosis Date  . HLD (hyperlipidemia)   . Hypertension     Past Surgical History:  Procedure Laterality Date  . HERNIA REPAIR      Current  Medications: No outpatient medications have been marked as taking for the 07/03/20 encounter (Appointment) with Sondra Barges, PA-C.    Allergies:   Patient has no known allergies.   Social History   Socioeconomic History  . Marital status: Married    Spouse name: Not on file  . Number of children: Not on file  . Years of education: Not on file  . Highest education level: Not on file  Occupational History  . Not on file  Tobacco Use  . Smoking status: Former Smoker    Types: Cigarettes    Quit date: 04/26/2016    Years since quitting: 4.1  . Smokeless tobacco: Never Used  Vaping Use  . Vaping Use: Never used  Substance and Sexual Activity  . Alcohol use: Yes  . Drug use: Never  . Sexual activity: Not on file  Other Topics Concern  . Not on file  Social History Narrative  . Not on file   Social Determinants of Health   Financial Resource Strain: Not on file  Food Insecurity: Not on file  Transportation Needs: Not on file  Physical Activity: Not on file  Stress: Not on file  Social Connections: Not on file     Family History:  The patient's family history is negative for Prostate cancer, Bladder Cancer, and Kidney cancer.  ROS:   ROS   EKGs/Labs/Other Studies Reviewed:    Studies reviewed were summarized above. The additional studies were reviewed today:  2D echo 06/05/2020: 1. Left ventricular ejection fraction, by estimation, is 55  to 60%. The  left ventricle has normal function. Left ventricular endocardial border  not optimally defined to evaluate regional wall motion. Left ventricular  diastolic parameters are  indeterminate.  2. Right ventricular systolic function is normal. The right ventricular  size is not well visualized.  3. The mitral valve is degenerative. Trivial mitral valve regurgitation.  No evidence of mitral stenosis.  4. The aortic valve was not well visualized. Aortic valve regurgitation  is not visualized. No aortic stenosis is  present.  5. The inferior vena cava is normal in size with greater than 50%  respiratory variability, suggesting right atrial pressure of 3 mmHg.   EKG:  EKG is ordered today.  The EKG ordered today demonstrates ***  Recent Labs: 06/05/2020: Magnesium 2.1; TSH 2.133 06/06/2020: BUN 17; Creatinine, Ser 0.95; Hemoglobin 13.4; Platelets 254; Potassium 3.5; Sodium 138  Recent Lipid Panel No results found for: CHOL, TRIG, HDL, CHOLHDL, VLDL, LDLCALC, LDLDIRECT  PHYSICAL EXAM:    VS:  There were no vitals taken for this visit.  BMI: There is no height or weight on file to calculate BMI.  Physical Exam  Wt Readings from Last 3 Encounters:  06/05/20 261 lb (118.4 kg)  01/26/19 250 lb (113.4 kg)  07/28/18 248 lb 14.4 oz (112.9 kg)     ASSESSMENT & PLAN:   1. Atrial flutter:  2. HTN: Blood pressure ***  3. Hypertriglyceridemia:  4. OSA:  5. Dyspnea? Lexiscan ? ***  Disposition: F/u with Dr. Okey Dupre or an APP in ***.   Medication Adjustments/Labs and Tests Ordered: Current medicines are reviewed at length with the patient today.  Concerns regarding medicines are outlined above. Medication changes, Labs and Tests ordered today are summarized above and listed in the Patient Instructions accessible in Encounters.   Signed, Eula Listen, PA-C 06/25/2020 10:50 AM     [facility] [address] [place], [place] [address] [phone]

## 2020-07-03 ENCOUNTER — Ambulatory Visit: Payer: PRIVATE HEALTH INSURANCE | Admitting: Physician Assistant

## 2020-07-03 DIAGNOSIS — R051 Acute cough: Secondary | ICD-10-CM | POA: Diagnosis not present

## 2020-07-06 ENCOUNTER — Encounter: Payer: Self-pay | Admitting: Nurse Practitioner

## 2020-07-06 ENCOUNTER — Ambulatory Visit (INDEPENDENT_AMBULATORY_CARE_PROVIDER_SITE_OTHER): Payer: BC Managed Care – PPO | Admitting: Nurse Practitioner

## 2020-07-06 ENCOUNTER — Other Ambulatory Visit: Payer: Self-pay

## 2020-07-06 VITALS — BP 140/90 | HR 80 | Ht 69.0 in | Wt 263.4 lb

## 2020-07-06 DIAGNOSIS — I4892 Unspecified atrial flutter: Secondary | ICD-10-CM

## 2020-07-06 DIAGNOSIS — F1010 Alcohol abuse, uncomplicated: Secondary | ICD-10-CM

## 2020-07-06 DIAGNOSIS — I10 Essential (primary) hypertension: Secondary | ICD-10-CM

## 2020-07-06 DIAGNOSIS — G4733 Obstructive sleep apnea (adult) (pediatric): Secondary | ICD-10-CM | POA: Diagnosis not present

## 2020-07-06 MED ORDER — METOPROLOL SUCCINATE ER 100 MG PO TB24: 100.0000 mg | ORAL_TABLET | Freq: Every day | ORAL | 3 refills | Status: DC

## 2020-07-06 NOTE — Patient Instructions (Addendum)
Medication Instructions:  We increase your metoprolol to 100mg  once a day   Lab Work: CBC & BMP were drawn today in clinic    If your tests are completely normal, you will receive your results only by: MyChart Message (if you have MyChart) OR . A paper copy in the mail If you have any lab test that is abnormal or we need to change your treatment, we will call you to review the results.   Testing/Procedures: None   Follow-Up: At [facility], you and your health needs are our priority.  As part of our continuing mission to provide you with exceptional heart care, we have created designated Provider Care Teams.  These Care Teams include your primary Cardiologist (physician) and Advanced Practice Providers (APPs -  Physician Assistants and Nurse Practitioners) who all work together to provide you with the care you need, when you need it.  We recommend signing up for the patient portal called "MyChart".  Sign up information is provided on this After Visit Summary.  MyChart is used to connect with patients for Virtual Visits (Telemedicine).  Patients are able to view lab/test results, encounter notes, upcoming appointments, etc.  Non-urgent messages can be sent to your provider as well.   To learn more about what you can do with MyChart, go to [facility].    Your next appointment:   4-6 weeks  The format for your next appointment:   In Person  Provider:   [email or web address], MD or Yvonne Kendall, NP   Other Instructions We will try to get you schedule with Dr. Nicolasa Ducking sooner for your Atrial flutter. Repeat EKG shown in clinic was Atrial flutter with heart rate elevated to the 130s. You were not symptomatic (no CP, short of breath, dizziness, etc...) Lalla Brothers, NP felt okay for you to be discharge home at this time, with follow-up. If anything changes call back or seek medication (ER) per Brion Aliment, NP attention if symptoms emerge.

## 2020-07-06 NOTE — Progress Notes (Signed)
Office Visit    Patient Name: Peter Hartman Date of Encounter: 07/06/2020  Primary Care Provider:  Marisue Ivan, MD Primary Cardiologist:  Yvonne Kendall, MD  Chief Complaint    70 year old male with a history of hypertension, COVID-19 (February 2021), hypertriglyceridemia, obesity, and obstructive sleep apnea, who presents for follow-up after recent hospitalization for atrial flutter.  Past Medical History    Past Medical History:  Diagnosis Date  . ETOH abuse   . History of echocardiogram    a. 05/2020 Echo: EF 55-60%, nl RV fxn. Triv MR.  Marland Kitchen HLD (hyperlipidemia)   . Hypertension   . OSA (obstructive sleep apnea)   . Paroxysmal atrial flutter (HCC)    a. Dx 05/2020-->converted w/ oral metoprolol. CHA2DS2VASc = 2-->eliquis.   Past Surgical History:  Procedure Laterality Date  . HERNIA REPAIR      Allergies  No Known Allergies  History of Present Illness    70 year old male with the above past medical history including hypertension, COVID-19 (February 2021), hypertriglyceridemia, obesity, and obstructive sleep apnea.  He was admitted to [facility] December 7 with weakness, lightheadedness, and finding of atrial flutter with rapid ventricular response.  He converted on oral metoprolol therapy. It was presumed that he was likely having asymptomatic paroxysms of atrial flutter at home. Echocardiogram showed normal LV function.  In the setting of a CHA2DS2-VASc of 2, he was placed on Eliquis therapy.  He was advised to improve compliance with CPAP and also reduce alcohol intake as he was drinking 3 beers a day.  Since discharge, he notes that he has done well. He has not had any chest pain or dyspnea and denies experiencing any palpitations. He does check his blood pressure at home and says it typically runs in the 140s over 90s. He notes that his Eliquis was expensive but prefers to stay on it rather than try an alternate agent or switch to Coumadin. He was in  sinus rhythm with PACs on arrival today but on examination he was noted to be tachycardic and repeat ECG shows atrial flutter at a rate of 139 bpm. He was completely asymptomatic. At home, he has not been experiencing any palpitations, PND, orthopnea, dizziness, syncope, edema, or early satiety.  Home Medications    Prior to Admission medications   Medication Sig Start Date End Date Taking? Authorizing Provider  apixaban (ELIQUIS) 5 MG TABS tablet Take 1 tablet (5 mg total) by mouth 2 (two) times daily. 06/06/20   Wouk, Wilfred Curtis, MD  fenofibrate micronized (LOFIBRA) 200 MG capsule Take 200 mg by mouth daily. 05/29/18   [provider]  lisinopril (ZESTRIL) 10 MG tablet Take 10 mg by mouth daily. 02/16/20   [provider]  metoprolol succinate (TOPROL XL) 50 MG 24 hr tablet Take 1 tablet (50 mg total) by mouth daily. Take with or immediately following a meal. 06/06/20 06/06/21  Wouk, Wilfred Curtis, MD  sildenafil (VIAGRA) 50 MG tablet Take 50 mg by mouth at bedtime. 01/20/20   [provider]    Review of Systems    He denies chest pain, palpitations, dyspnea, pnd, orthopnea, n, v, dizziness, syncope, edema, weight gain, or early satiety.  All other systems reviewed and are otherwise negative except as noted above.  Physical Exam    VS:  BP 140/90 (BP Location: Left Arm, Patient Position: Sitting, Cuff Size: Large)   Pulse 80   Ht 5\' 9"  (1.753 m)   Wt 263 lb 6 oz (119.5  kg)   SpO2 95%   BMI 38.89 kg/m  , BMI Body mass index is 38.89 kg/m. GEN: Well nourished, well developed, in no acute distress. HEENT: normal. Neck: Supple, no JVD, carotid bruits, or masses. Cardiac: RRR, tachycardic, no murmurs, rubs, or gallops. No clubbing, cyanosis, edema.  Radials/PT 2+ and equal bilaterally.  Respiratory:  Respirations regular and unlabored, clear to auscultation bilaterally. GI: Obese, soft, nontender, nondistended, BS + x 4. MS: no deformity or atrophy. Skin: warm  and dry, no rash. Neuro:  Strength and sensation are intact. Psych: Normal affect.  Accessory Clinical Findings    ECG personally reviewed by me today -initial ECG today showed sinus rhythm with PACs and early repolarization. There were no acute changes. After noted to be tachycardic on examination, repeat ECG shows atrial flutter at a rate of 139 bpm without acute ST or T changes  Lab Results  Component Value Date   WBC 9.0 06/06/2020   HGB 13.4 06/06/2020   HCT 41.2 06/06/2020   MCV 81.1 06/06/2020   PLT 254 06/06/2020   Lab Results  Component Value Date   CREATININE 0.95 06/06/2020   BUN 17 06/06/2020   NA 138 06/06/2020   K 3.5 06/06/2020   CL 103 06/06/2020   CO2 25 06/06/2020   Lab Results  Component Value Date   ALT 32 10/11/2011   AST 23 10/11/2011   ALKPHOS 65 10/11/2011   BILITOT 0.2 10/11/2011    Assessment & Plan    1. Paroxysmal atrial flutter with rapid ventricular response: Patient was recently seen in the emergency department at [facility] with weakness, lightheadedness, and finding of rapid atrial flutter. He was given oral metoprolol in the ED and subsequently converted to sinus rhythm. Echo showed normal LV function. It was felt that he was most likely having brief paroxysms of atrial flutter at home, prior to admission. He has been anticoagulated with Eliquis and has been tolerating well. He arrived in sinus rhythm with PACs today but on examination I noted that he was tachycardic. I repeated ECG and this showed atrial flutter at a rate of 139 bpm. He is completely asymptomatic. I am increasing his Toprol-XL to 100 mg daily and he was advised that if he develops chest pain, dyspnea, presyncope, or other symptoms, that he may need to present to the emergency department for further evaluation. As he felt well, he preferred to go go home and take additional metoprolol. He was agreeable to arrange for follow-up visit in the office next week to reassess rhythm  and symptoms, as well as EP referral. Follow-up CBC and basic metabolic panel today.  2. Obstructive sleep apnea: He reports compliance with CPAP.  3. Essential hypertension: Pressure elevated at 140/90. Increasing metoprolol XL to 100 mg daily.  4. Alcohol abuse: Still drinking at least 3 beers a day if not more. We discussed the role of alcohol and atrial arrhythmias. Complete cessation advised. He says he will not be quitting.  5. Disposition: As patient is asymptomatic, he is going to return home and take additional metoprolol. I will see him back next week to reevaluate. I have also made referral to EP for paroxysmal atrial flutter. He would be interested in catheter ablation if appropriate.   Nicolasa Ducking, NP 07/06/2020, 6:58 PM

## 2020-07-07 LAB — CBC
Hematocrit: 42.5 % (ref 37.5–51.0)
Hemoglobin: 13.8 g/dL (ref 13.0–17.7)
MCH: 25.8 pg — ABNORMAL LOW (ref 26.6–33.0)
MCHC: 32.5 g/dL (ref 31.5–35.7)
MCV: 80 fL (ref 79–97)
Platelets: 475 10*3/uL — ABNORMAL HIGH (ref 150–450)
RBC: 5.34 x10E6/uL (ref 4.14–5.80)
RDW: 13.4 % (ref 11.6–15.4)
WBC: 12.4 10*3/uL — ABNORMAL HIGH (ref 3.4–10.8)

## 2020-07-07 LAB — BASIC METABOLIC PANEL
BUN/Creatinine Ratio: 17 (ref 10–24)
BUN: 17 mg/dL (ref 8–27)
CO2: 24 mmol/L (ref 20–29)
Calcium: 9.2 mg/dL (ref 8.6–10.2)
Chloride: 104 mmol/L (ref 96–106)
Creatinine, Ser: 1 mg/dL (ref 0.76–1.27)
GFR calc Af Amer: 90 mL/min/{1.73_m2} (ref >59)
GFR calc non Af Amer: 78 mL/min/{1.73_m2} (ref >59)
Glucose: 88 mg/dL (ref 65–99)
Potassium: 4.2 mmol/L (ref 3.5–5.2)
Sodium: 142 mmol/L (ref 134–144)

## 2020-07-12 ENCOUNTER — Encounter: Payer: Self-pay | Admitting: Internal Medicine

## 2020-07-12 ENCOUNTER — Other Ambulatory Visit: Payer: Self-pay

## 2020-07-12 ENCOUNTER — Ambulatory Visit (INDEPENDENT_AMBULATORY_CARE_PROVIDER_SITE_OTHER): Payer: BC Managed Care – PPO | Admitting: Internal Medicine

## 2020-07-12 ENCOUNTER — Ambulatory Visit: Payer: Medicare HMO | Admitting: Nurse Practitioner

## 2020-07-12 VITALS — BP 140/78 | HR 86 | Ht 69.0 in | Wt 264.2 lb

## 2020-07-12 DIAGNOSIS — I4892 Unspecified atrial flutter: Secondary | ICD-10-CM

## 2020-07-12 DIAGNOSIS — I10 Essential (primary) hypertension: Secondary | ICD-10-CM | POA: Diagnosis not present

## 2020-07-12 DIAGNOSIS — I493 Ventricular premature depolarization: Secondary | ICD-10-CM | POA: Diagnosis not present

## 2020-07-12 MED ORDER — METOPROLOL SUCCINATE ER 100 MG PO TB24: 150.0000 mg | ORAL_TABLET | Freq: Every day | ORAL | 2 refills | Status: DC

## 2020-07-12 NOTE — Progress Notes (Signed)
Follow-up Outpatient Visit Date: 07/12/2020  Primary Care Provider: Marisue Ivan, MD [phone] [facility] [address] [facility] [place] [address]  Chief Complaint: Follow-up atrial flutter  HPI:  Mr. Caughlin is a 70 y.o. male with history of atrial flutter, hypertension, hypertriglyceridemia, COVID-19 infection (08/2019), obstructive sleep apnea, and obesity, who presents for follow-up of atrial flutter.  He was last seen in our office by Ward Givens, NP, a week ago at which time Mr. Criado was feeling well.  Initial EKG during the visit showed sinus rhythm with PACs.  However, on examination he was noted to be tachycardic with repeat tracing showing atrial flutter with a ventricular rate of 139 bpm.  Mr. Luu was asymptomatic.  Metoprolol succinate was increased to 100 mg daily.  Apixaban was continued.  EP referral was made.  Today, Mr. Antunes reports that he has been feeling well, denying palpitations, lightheadedness, chest pain, and shortness of breath.  He is tolerating increased dose of metoprolol without any side effects.  He remains compliant with apixaban and denies bleeding.  --------------------------------------------------------------------------------------------------  Past Medical History:  Diagnosis Date  . ETOH abuse   . History of echocardiogram    a. 05/2020 Echo: EF 55-60%, nl RV fxn. Triv MR.  Marland Kitchen HLD (hyperlipidemia)   . Hypertension   . OSA (obstructive sleep apnea)   . Paroxysmal atrial flutter (HCC)    a. Dx 05/2020-->converted w/ oral metoprolol. CHA2DS2VASc = 2-->eliquis.   Past Surgical History:  Procedure Laterality Date  . HERNIA REPAIR      Current Meds  Medication Sig  . apixaban (ELIQUIS) 5 MG TABS tablet Take 1 tablet (5 mg total) by mouth 2 (two) times daily.  . benzonatate (TESSALON) 200 MG capsule Take 200 mg by mouth 3 (three) times daily as needed for cough.  . diphenhydrAMINE (BENADRYL) 25 MG tablet Take 25 mg by mouth as  needed.  . fenofibrate micronized (LOFIBRA) 200 MG capsule Take 200 mg by mouth daily.  Marland Kitchen lisinopril (ZESTRIL) 10 MG tablet Take 10 mg by mouth daily.  . metoprolol succinate (TOPROL-XL) 100 MG 24 hr tablet Take 1 tablet (100 mg total) by mouth daily. Take with or immediately following a meal.  . sildenafil (VIAGRA) 50 MG tablet Take 50 mg by mouth at bedtime.    Allergies: Patient has no known allergies.  Social History   Tobacco Use  . Smoking status: Former Smoker    Types: Cigarettes    Quit date: 04/26/2016    Years since quitting: 4.2  . Smokeless tobacco: Never Used  Vaping Use  . Vaping Use: Never used  Substance Use Topics  . Alcohol use: Yes  . Drug use: Never    Family History  Problem Relation Age of Onset  . Prostate cancer Neg Hx   . Bladder Cancer Neg Hx   . Kidney cancer Neg Hx     Review of Systems: A 12-system review of systems was performed and was negative except as noted in the HPI.  --------------------------------------------------------------------------------------------------  Physical Exam: BP 140/78 (BP Location: Left Arm, Patient Position: Sitting, Cuff Size: Large)   Pulse 86   Ht 5\' 9"  (1.753 m)   Wt 264 lb 4 oz (119.9 kg)   SpO2 95%   BMI 39.02 kg/m   General:  NAD. Neck: No JVD or HJR. Lungs: Clear to auscultation bilaterally without wheezes or crackles. Heart: Regular rate and rhythm with occasional extrasystoles.  No murmurs, rubs, or gallops. Abdomen: Soft, nontender, nondistended. Extremities:  No lower extremity edema.  EKG: Normal sinus rhythm with PVCs, including ventricular couplets.  Lab Results  Component Value Date   WBC 12.4 (H) 07/06/2020   HGB 13.8 07/06/2020   HCT 42.5 07/06/2020   MCV 80 07/06/2020   PLT 475 (H) 07/06/2020    Lab Results  Component Value Date   NA 142 07/06/2020   K 4.2 07/06/2020   CL 104 07/06/2020   CO2 24 07/06/2020   BUN 17 07/06/2020   CREATININE 1.00 07/06/2020   GLUCOSE 88  07/06/2020   ALT 32 10/11/2011    No results found for: CHOL, HDL, LDLCALC, LDLDIRECT, TRIG, CHOLHDL  --------------------------------------------------------------------------------------------------  ASSESSMENT AND PLAN: Paroxysmal atrial flutter: Mr. Aloi has converted back to sinus rhythm.  Given PVCs and elevated blood pressure today, we will increase metoprolol succinate to 150 mg daily.  EP referral is pending to discuss ablation.  We will continue anticoagulation with apixaban.  PVCs: Incidentally noted on EKG today.  Mr. Hauk does not have any symptoms of heart failure or ischemic heart disease but has multiple cardiac risk factors including hypertension, hyperlipidemia, obesity, age, gender, and prior tobacco use.  I recommended noninvasive ischemia testing with pharmacologic myocardial perfusion stress test, though Mr. Vanderschaaf wishes to think about this before proceeding.  He should reach out to our office if he would like to move forward with this.  Hypertension: Blood pressure mildly elevated today.  We will increase metoprolol succinate to 150 mg daily.  Follow-up: Return to clinic in 3 months.  Yvonne Kendall, MD 07/12/2020 9:28 AM

## 2020-07-12 NOTE — Patient Instructions (Signed)
Medication Instructions:  Your physician has recommended you make the following change in your medication:  1- INCREASE Metoprolol succinate to 150 mg (1.5 tablets) by mouth once a day.  *If you need a refill on your cardiac medications before your next appointment, please call your pharmacy*  Lab Work: none If you have labs (blood work) drawn today and your tests are completely normal, you will receive your results only by: Marland Kitchen MyChart Message (if you have MyChart) OR . A paper copy in the mail If you have any lab test that is abnormal or we need to change your treatment, we will call you to review the results.  Testing/Procedures: none  Follow-Up: At [facility], you and your health needs are our priority.  As part of our continuing mission to provide you with exceptional heart care, we have created designated Provider Care Teams.  These Care Teams include your primary Cardiologist (physician) and Advanced Practice Providers (APPs -  Physician Assistants and Nurse Practitioners) who all work together to provide you with the care you need, when you need it.  We recommend signing up for the patient portal called "MyChart".  Sign up information is provided on this After Visit Summary.  MyChart is used to connect with patients for Virtual Visits (Telemedicine).  Patients are able to view lab/test results, encounter notes, upcoming appointments, etc.  Non-urgent messages can be sent to your provider as well.   To learn more about what you can do with MyChart, go to [email or web address].    Your next appointment:   3 month(s)  The format for your next appointment:   In Person  Provider:   You may see Yvonne Kendall, MD or one of the following Advanced Practice Providers on your designated Care Team:    Nicolasa Ducking, NP  Eula Listen, PA-C  Marisue Ivan, PA-C  [place], [place]  Gillian Shields, NP

## 2020-07-19 ENCOUNTER — Ambulatory Visit: Payer: Medicare HMO | Admitting: Nurse Practitioner

## 2020-08-10 DIAGNOSIS — E781 Pure hyperglyceridemia: Secondary | ICD-10-CM | POA: Diagnosis not present

## 2020-08-10 DIAGNOSIS — I10 Essential (primary) hypertension: Secondary | ICD-10-CM | POA: Diagnosis not present

## 2020-08-10 DIAGNOSIS — Z862 Personal history of diseases of the blood and blood-forming organs and certain disorders involving the immune mechanism: Secondary | ICD-10-CM | POA: Diagnosis not present

## 2020-08-17 DIAGNOSIS — G4733 Obstructive sleep apnea (adult) (pediatric): Secondary | ICD-10-CM | POA: Diagnosis not present

## 2020-08-17 DIAGNOSIS — E781 Pure hyperglyceridemia: Secondary | ICD-10-CM | POA: Diagnosis not present

## 2020-08-17 DIAGNOSIS — Z0000 Encounter for general adult medical examination without abnormal findings: Secondary | ICD-10-CM | POA: Diagnosis not present

## 2020-08-17 DIAGNOSIS — I10 Essential (primary) hypertension: Secondary | ICD-10-CM | POA: Diagnosis not present

## 2020-08-17 DIAGNOSIS — Z9989 Dependence on other enabling machines and devices: Secondary | ICD-10-CM | POA: Diagnosis not present

## 2020-08-17 DIAGNOSIS — Z862 Personal history of diseases of the blood and blood-forming organs and certain disorders involving the immune mechanism: Secondary | ICD-10-CM | POA: Diagnosis not present

## 2020-08-17 DIAGNOSIS — Z8679 Personal history of other diseases of the circulatory system: Secondary | ICD-10-CM | POA: Diagnosis not present

## 2020-10-15 ENCOUNTER — Other Ambulatory Visit: Payer: Self-pay | Admitting: Obstetrics and Gynecology

## 2020-10-23 ENCOUNTER — Telehealth: Payer: Self-pay | Admitting: Internal Medicine

## 2020-10-23 MED ORDER — APIXABAN 5 MG PO TABS: 5.0000 mg | ORAL_TABLET | Freq: Two times a day (BID) | ORAL | 1 refills | Status: DC

## 2020-10-23 NOTE — Addendum Note (Signed)
Addended by: Satira Sark on: 10/23/2020 10:14 AM   Modules accepted: Orders

## 2020-10-23 NOTE — Telephone Encounter (Signed)
Pt last saw Dr End 07/12/20, last labs 08/10/20 Creat 1.1, age 70, weight 119.9kg, based on specified criteria pt is on appropriate dosage of Eliquis 5mg  BID.  Will refill rx.

## 2020-10-23 NOTE — Telephone Encounter (Signed)
*  STAT* If patient is at the pharmacy, call can be transferred to refill team.   1. Which medications need to be refilled? (please list name of each medication and dose if known)  Eliquis 5 MG 1 tablet 2 times daily   2. Which pharmacy/location (including street and city if local pharmacy) is medication to be sent to? [facility] on [address]   3. Do they need a 30 day or 90 day supply? 90 day

## 2020-10-24 ENCOUNTER — Ambulatory Visit: Payer: Medicare HMO | Admitting: Internal Medicine

## 2020-11-15 ENCOUNTER — Ambulatory Visit (INDEPENDENT_AMBULATORY_CARE_PROVIDER_SITE_OTHER): Payer: BC Managed Care – PPO | Admitting: Internal Medicine

## 2020-11-15 ENCOUNTER — Other Ambulatory Visit: Payer: Self-pay

## 2020-11-15 ENCOUNTER — Encounter: Payer: Self-pay | Admitting: Internal Medicine

## 2020-11-15 VITALS — BP 134/80 | HR 67 | Ht 69.0 in | Wt 260.1 lb

## 2020-11-15 DIAGNOSIS — I4892 Unspecified atrial flutter: Secondary | ICD-10-CM

## 2020-11-15 DIAGNOSIS — I10 Essential (primary) hypertension: Secondary | ICD-10-CM

## 2020-11-15 DIAGNOSIS — I493 Ventricular premature depolarization: Secondary | ICD-10-CM

## 2020-11-15 MED ORDER — METOPROLOL SUCCINATE ER 100 MG PO TB24: 150.0000 mg | ORAL_TABLET | Freq: Every day | ORAL | 3 refills | Status: DC

## 2020-11-15 NOTE — Progress Notes (Signed)
Follow-up Outpatient Visit Date: 11/15/2020  Primary Care Provider: Marisue Ivan, MD [phone] [facility] [address] [facility] [place] [address]  Chief Complaint: Follow-up atrial flutter  HPI:  Peter Hartman is a 70 y.o. male with history of atrial flutter, HTN, hypertriglyceridemia, COVID-19 infection (08/2019), obstructive sleep apnea, and obesity, who presents for follow-up of atrial flutter.  I last saw him in 06/2020, at which time he was feeling well without palpitations, lightheadedness, or chest pain.  He was tolerating metoprolol and apixaban well.  Due to elevated blood pressure and PVCs noted at that time, metoprolol succinate was increased to 150 mg daily.  Today, Peter Hartman reports that he has been doing well without palpitations, chest pain, shortness of breath, lightheadedness, or edema.  He was walking on the track again without difficulty.  He remains compliant with his medications, though he ran out of metoprolol succinate yesterday.  He remains on apixaban without bleeding.  --------------------------------------------------------------------------------------------------  Past Medical History:  Diagnosis Date  . ETOH abuse   . History of echocardiogram    a. 05/2020 Echo: EF 55-60%, nl RV fxn. Triv MR.  Marland Kitchen HLD (hyperlipidemia)   . Hypertension   . OSA (obstructive sleep apnea)   . Paroxysmal atrial flutter (HCC)    a. Dx 05/2020-->converted w/ oral metoprolol. CHA2DS2VASc = 2-->eliquis.   Past Surgical History:  Procedure Laterality Date  . HERNIA REPAIR      Current Meds  Medication Sig  . apixaban (ELIQUIS) 5 MG TABS tablet Take 1 tablet (5 mg total) by mouth 2 (two) times daily.  . benzonatate (TESSALON) 200 MG capsule Take 200 mg by mouth 3 (three) times daily as needed for cough.  . diphenhydrAMINE (BENADRYL) 25 MG tablet Take 25 mg by mouth as needed.  . fenofibrate micronized (LOFIBRA) 200 MG capsule Take 200 mg by mouth daily.  Marland Kitchen lisinopril  (ZESTRIL) 10 MG tablet Take 10 mg by mouth daily.  . metoprolol succinate (TOPROL-XL) 100 MG 24 hr tablet Take 1.5 tablets (150 mg total) by mouth daily. Take with or immediately following a meal.  . sildenafil (VIAGRA) 50 MG tablet Take 50 mg by mouth at bedtime.    Allergies: Patient has no known allergies.  Social History   Tobacco Use  . Smoking status: Former Smoker    Types: Cigarettes    Quit date: 04/26/2016    Years since quitting: 4.5  . Smokeless tobacco: Never Used  Vaping Use  . Vaping Use: Never used  Substance Use Topics  . Alcohol use: Yes  . Drug use: Never    Family History  Problem Relation Age of Onset  . Prostate cancer Neg Hx   . Bladder Cancer Neg Hx   . Kidney cancer Neg Hx     Review of Systems: A 12-system review of systems was performed and was negative except as noted in the HPI.  --------------------------------------------------------------------------------------------------  Physical Exam: BP 134/80 (BP Location: Left Arm, Patient Position: Sitting, Cuff Size: Large)   Pulse 67   Ht 5\' 9"  (1.753 m)   Wt 260 lb 2 oz (118 kg)   SpO2 98%   BMI 38.41 kg/m   General:  NAD. Neck: No JVD or HJR. Lungs: Clear to auscultation bilaterally without wheezes or crackles. Heart: Regular rate and rhythm without murmurs, rubs, or gallops. Abdomen: Soft, nontender, nondistended. Extremities: No lower extremity edema.  EKG: Normal sinus rhythm with possible left atrial enlargement and early repolarization.  PVCs are no longer present.  Otherwise, no significant change since 07/12/2020.  Lab Results  Component Value Date   WBC 12.4 (H) 07/06/2020   HGB 13.8 07/06/2020   HCT 42.5 07/06/2020   MCV 80 07/06/2020   PLT 475 (H) 07/06/2020    Lab Results  Component Value Date   NA 142 07/06/2020   K 4.2 07/06/2020   CL 104 07/06/2020   CO2 24 07/06/2020   BUN 17 07/06/2020   CREATININE 1.00 07/06/2020   GLUCOSE 88 07/06/2020   ALT 32  10/11/2011    No results found for: CHOL, HDL, LDLCALC, LDLDIRECT, TRIG, CHOLHDL  --------------------------------------------------------------------------------------------------  ASSESSMENT AND PLAN: Atrial flutter: Peter Hartman is in sinus rhythm today and has not had any symptoms to suggest recurrent atrial flutter.  Given a CHA2DS2-VASc score of at least 2 (age and hypertension), long-term anticoagulation is indicated.  We again discussed referral to electrophysiology for consideration of ablation.  Peter Hartman wishes to defer this.  Continue metoprolol succinate 150 mg daily.  PVCs: No palpitations reported.  EKG today without PVCs.  We will plan to continue metoprolol succinate 100 mg daily.  Hypertension: Upper normal blood pressure today.  No medication changes at this time.  Ongoing management per PCP.  Follow-up: Return to clinic in 6 months.  Yvonne Kendall, MD 11/15/2020 10:34 AM

## 2020-11-15 NOTE — Patient Instructions (Signed)
Medication Instructions:  Your physician recommends that you continue on your current medications as directed. Please refer to the Current Medication list given to you today.  *If you need a refill on your cardiac medications before your next appointment, please call your pharmacy*   Lab Work: None ordered If you have labs (blood work) drawn today and your tests are completely normal, you will receive your results only by: . MyChart Message (if you have MyChart) OR . A paper copy in the mail If you have any lab test that is abnormal or we need to change your treatment, we will call you to review the results.   Testing/Procedures: None ordered   Follow-Up: At [facility], you and your health needs are our priority.  As part of our continuing mission to provide you with exceptional heart care, we have created designated Provider Care Teams.  These Care Teams include your primary Cardiologist (physician) and Advanced Practice Providers (APPs -  Physician Assistants and Nurse Practitioners) who all work together to provide you with the care you need, when you need it.  We recommend signing up for the patient portal called "MyChart".  Sign up information is provided on this After Visit Summary.  MyChart is used to connect with patients for Virtual Visits (Telemedicine).  Patients are able to view lab/test results, encounter notes, upcoming appointments, etc.  Non-urgent messages can be sent to your provider as well.   To learn more about what you can do with MyChart, go to [email or web address].com.    Your next appointment:   Your physician wants you to follow-up in: 6 months You will receive a reminder letter in the mail two months in advance. If you don't receive a letter, please call our office to schedule the follow-up appointment.   The format for your next appointment:   In Person  Provider:   You may see Christopher End, MD or one of the following Advanced Practice Providers on  your designated Care Team:    Christopher Berge, NP  Ryan Dunn, PA-C  Jacquelyn Visser, PA-C  Cadence Furth, PA-C  Caitlin Walker, NP    Other Instructions N/A  

## 2021-02-12 DIAGNOSIS — I10 Essential (primary) hypertension: Secondary | ICD-10-CM | POA: Diagnosis not present

## 2021-02-12 DIAGNOSIS — Z131 Encounter for screening for diabetes mellitus: Secondary | ICD-10-CM | POA: Diagnosis not present

## 2021-02-12 DIAGNOSIS — E781 Pure hyperglyceridemia: Secondary | ICD-10-CM | POA: Diagnosis not present

## 2021-02-12 DIAGNOSIS — Z125 Encounter for screening for malignant neoplasm of prostate: Secondary | ICD-10-CM | POA: Diagnosis not present

## 2021-02-12 DIAGNOSIS — Z862 Personal history of diseases of the blood and blood-forming organs and certain disorders involving the immune mechanism: Secondary | ICD-10-CM | POA: Diagnosis not present

## 2021-02-19 DIAGNOSIS — R7303 Prediabetes: Secondary | ICD-10-CM | POA: Diagnosis not present

## 2021-02-19 DIAGNOSIS — I10 Essential (primary) hypertension: Secondary | ICD-10-CM | POA: Diagnosis not present

## 2021-02-19 DIAGNOSIS — J310 Chronic rhinitis: Secondary | ICD-10-CM | POA: Diagnosis not present

## 2021-02-19 DIAGNOSIS — F5101 Primary insomnia: Secondary | ICD-10-CM | POA: Diagnosis not present

## 2021-02-19 DIAGNOSIS — Z862 Personal history of diseases of the blood and blood-forming organs and certain disorders involving the immune mechanism: Secondary | ICD-10-CM | POA: Diagnosis not present

## 2021-02-19 DIAGNOSIS — Z0000 Encounter for general adult medical examination without abnormal findings: Secondary | ICD-10-CM | POA: Diagnosis not present

## 2021-03-18 DIAGNOSIS — J019 Acute sinusitis, unspecified: Secondary | ICD-10-CM | POA: Diagnosis not present

## 2021-03-18 DIAGNOSIS — J310 Chronic rhinitis: Secondary | ICD-10-CM | POA: Diagnosis not present

## 2021-04-15 DIAGNOSIS — J310 Chronic rhinitis: Secondary | ICD-10-CM | POA: Diagnosis not present

## 2021-05-10 ENCOUNTER — Other Ambulatory Visit: Payer: Self-pay | Admitting: Internal Medicine

## 2021-05-10 NOTE — Telephone Encounter (Signed)
Eliquis 5mg  refill request received. Patient is 70 years old, weight-118kg, Crea-1.00 on 07/06/2020, Diagnosis-Aflutter, and last seen by 09/03/2020 on 11/15/2020. Dose is appropriate based on dosing criteria. Will send in refill to requested pharmacy.

## 2021-05-10 NOTE — Telephone Encounter (Signed)
Please review for refill, Thanks !  

## 2021-08-28 DIAGNOSIS — Z8679 Personal history of other diseases of the circulatory system: Secondary | ICD-10-CM | POA: Diagnosis not present

## 2021-08-28 DIAGNOSIS — E781 Pure hyperglyceridemia: Secondary | ICD-10-CM | POA: Diagnosis not present

## 2021-08-28 DIAGNOSIS — I10 Essential (primary) hypertension: Secondary | ICD-10-CM | POA: Diagnosis not present

## 2021-08-28 DIAGNOSIS — Z862 Personal history of diseases of the blood and blood-forming organs and certain disorders involving the immune mechanism: Secondary | ICD-10-CM | POA: Diagnosis not present

## 2021-08-28 DIAGNOSIS — R7303 Prediabetes: Secondary | ICD-10-CM | POA: Diagnosis not present

## 2021-09-04 DIAGNOSIS — I4892 Unspecified atrial flutter: Secondary | ICD-10-CM | POA: Diagnosis not present

## 2021-09-04 DIAGNOSIS — Z8679 Personal history of other diseases of the circulatory system: Secondary | ICD-10-CM | POA: Diagnosis not present

## 2021-09-04 DIAGNOSIS — E781 Pure hyperglyceridemia: Secondary | ICD-10-CM | POA: Diagnosis not present

## 2021-09-04 DIAGNOSIS — R7303 Prediabetes: Secondary | ICD-10-CM | POA: Diagnosis not present

## 2021-09-04 DIAGNOSIS — I10 Essential (primary) hypertension: Secondary | ICD-10-CM | POA: Diagnosis not present

## 2021-09-04 DIAGNOSIS — Z862 Personal history of diseases of the blood and blood-forming organs and certain disorders involving the immune mechanism: Secondary | ICD-10-CM | POA: Diagnosis not present

## 2021-09-06 IMAGING — DX DG CHEST 1V PORT
1 series · 1 of 1 positions shown · non-contrast
Comparison: 10/11/2011

CLINICAL DATA: Chest pain

EXAM:
PORTABLE CHEST 1 VIEW

[chest ap]
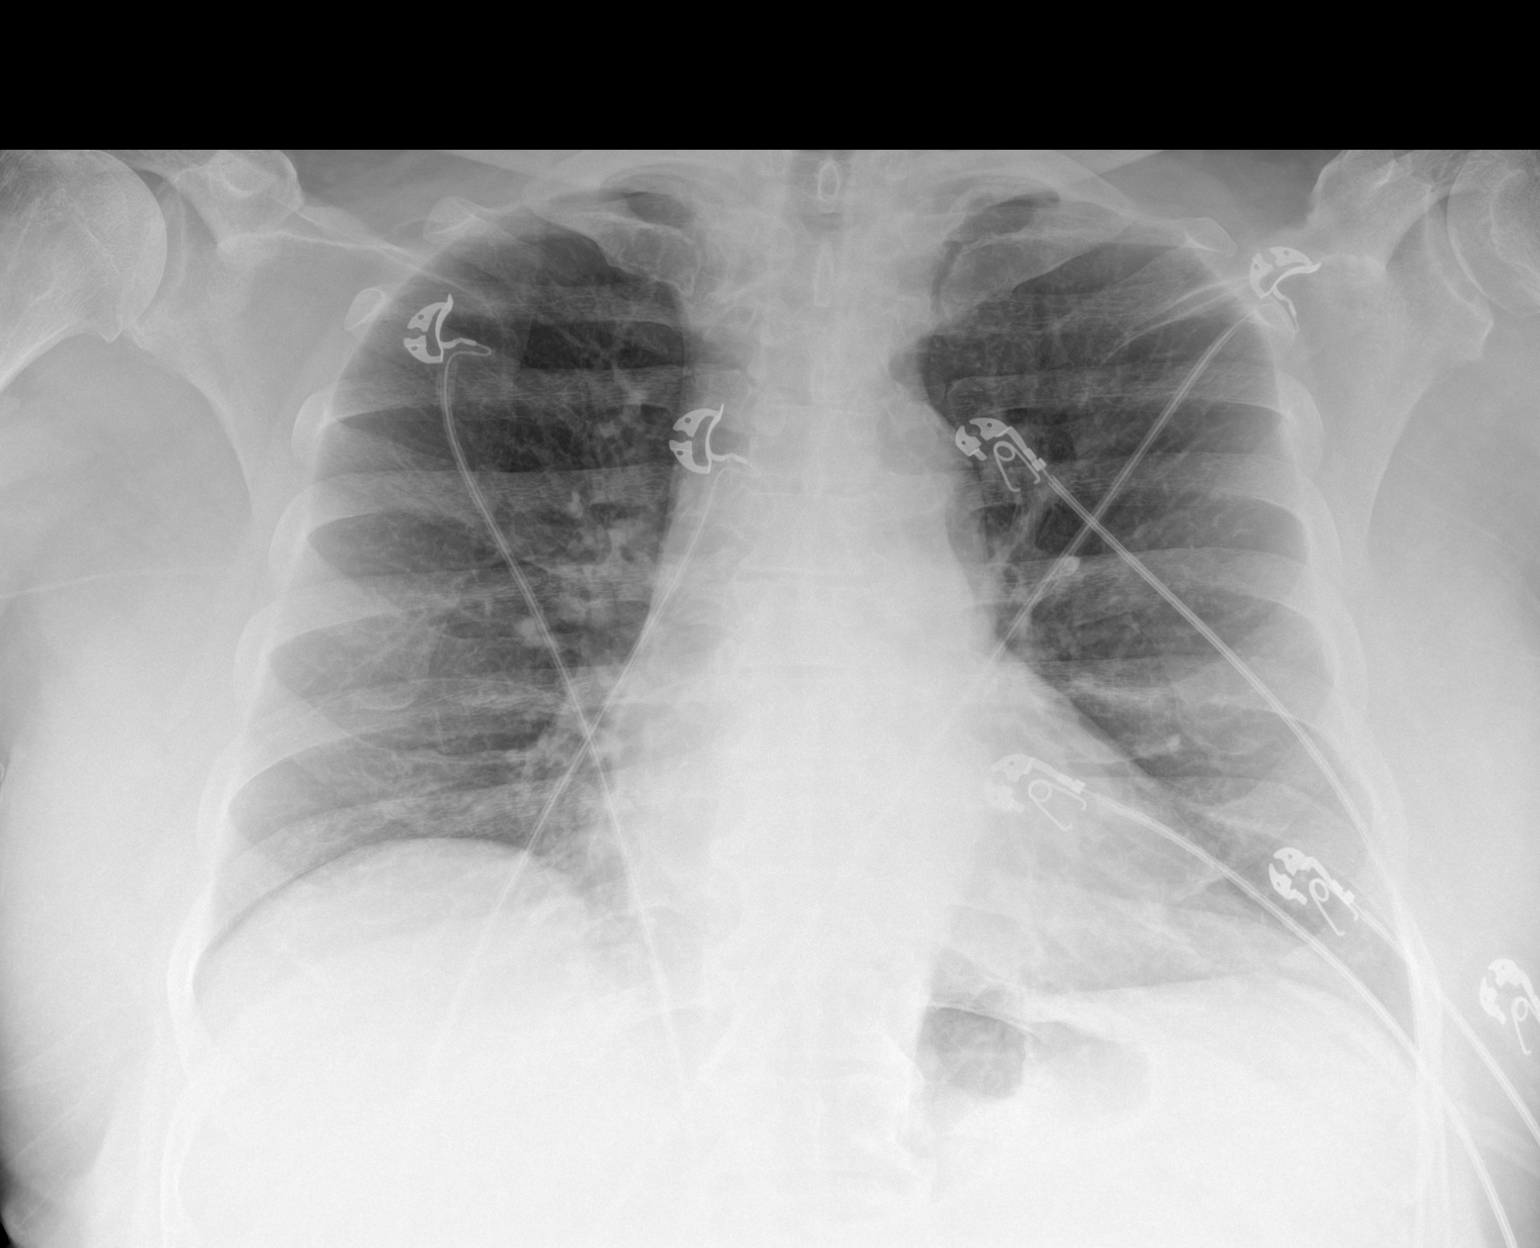

[1 of 1 positions shown; findings below may reference images not displayed]

FINDINGS: Normal heart size and mediastinal contours. No acute infiltrate or
edema. No effusion or pneumothorax. No acute osseous findings.
IMPRESSION: Stable exam.  No evidence of active disease.

## 2021-10-17 DIAGNOSIS — M25512 Pain in left shoulder: Secondary | ICD-10-CM | POA: Diagnosis not present

## 2021-10-17 DIAGNOSIS — S4992XA Unspecified injury of left shoulder and upper arm, initial encounter: Secondary | ICD-10-CM | POA: Diagnosis not present

## 2021-10-24 DIAGNOSIS — S46012A Strain of muscle(s) and tendon(s) of the rotator cuff of left shoulder, initial encounter: Secondary | ICD-10-CM | POA: Diagnosis not present

## 2021-10-24 DIAGNOSIS — M25512 Pain in left shoulder: Secondary | ICD-10-CM | POA: Diagnosis not present

## 2021-10-25 ENCOUNTER — Other Ambulatory Visit: Payer: Self-pay | Admitting: Orthopedic Surgery

## 2021-10-25 DIAGNOSIS — S46012A Strain of muscle(s) and tendon(s) of the rotator cuff of left shoulder, initial encounter: Secondary | ICD-10-CM

## 2021-11-05 NOTE — Progress Notes (Signed)
Error, precepting new NP ?

## 2021-11-07 ENCOUNTER — Encounter: Payer: Self-pay | Admitting: Physician Assistant

## 2021-11-07 ENCOUNTER — Ambulatory Visit (INDEPENDENT_AMBULATORY_CARE_PROVIDER_SITE_OTHER): Payer: BC Managed Care – PPO | Admitting: Physician Assistant

## 2021-11-07 VITALS — BP 150/84 | HR 62 | Ht 69.0 in | Wt 273.1 lb

## 2021-11-07 DIAGNOSIS — M25512 Pain in left shoulder: Secondary | ICD-10-CM | POA: Diagnosis not present

## 2021-11-07 DIAGNOSIS — I4892 Unspecified atrial flutter: Secondary | ICD-10-CM | POA: Diagnosis not present

## 2021-11-07 DIAGNOSIS — F1010 Alcohol abuse, uncomplicated: Secondary | ICD-10-CM

## 2021-11-07 DIAGNOSIS — I10 Essential (primary) hypertension: Secondary | ICD-10-CM

## 2021-11-07 DIAGNOSIS — I493 Ventricular premature depolarization: Secondary | ICD-10-CM | POA: Diagnosis not present

## 2021-11-07 DIAGNOSIS — E785 Hyperlipidemia, unspecified: Secondary | ICD-10-CM

## 2021-11-07 NOTE — Progress Notes (Signed)
? ?Cardiology Office Note   ? ?Date:  11/07/2021  ? ?ID:  Peter Hartman, DOB 05-29-1951, MRN [id number] ? ?PCP:  Marisue Ivan, MD  ?Cardiologist:  Yvonne Kendall, MD  ?Electrophysiologist:  None  ? ?Chief Complaint: Follow-up ? ?History of Present Illness:  ? ?Peter Hartman is a 71 y.o. male with history of atrial flutter on apixaban, HTN, hypertriglyceridemia, obesity, and OSA who presents for follow-up of atrial flutter. ? ?He was admitted to the hospital in 05/2020 with new onset atrial flutter with RVR and converted on oral metoprolol.  It was assumed he was having paroxysms of atrial flutter at home.  Echo in 05/2020 demonstrated an EF of 55 to 60%, normal RV systolic function, trivial mitral regurgitation, and an estimated right atrial pressure of 3 mmHg.  Toprol has previously been titrated due to elevated BP and PVCs.  He was most recently seen in the office in 10/2020, and was doing well from a cardiac perspective without symptoms of angina or decompensation. ? ?He comes in doing very well from a cardiac perspective and is without symptoms of angina or decompensation.  No palpitations, dizziness, presyncope, or syncope.  No shortness of breath.  No lower extremity swelling or orthopnea.  He has not had any falls, hematochezia, or melena.  His blood pressure is typically well controlled at home and he reports a history of whitecoat hypertension.  He is tolerant and adherent to his cardiac medications.  He did injure his left shoulder and has an MRI pending.  He is uncertain if he will need surgery of the left shoulder. ? ?RCRI: Low risk for noncardiac surgery ?Duke Activity Status Index: Greater than 4 METs without cardiac limitation ? ? ?Labs independently reviewed: ?08/2021 - Hgb 13.4, PLT 269, potassium 4.8, BUN 18, serum creatinine 1.0, albumin 3.9, AST/ALT normal, A1c 5.9, TC 162, TG 164, HDL 56, LDL 73 ?05/2020 - TSH normal, magnesium 2.1 ? ?Past Medical History:  ?Diagnosis Date  ? ETOH abuse    ? History of echocardiogram   ? a. 05/2020 Echo: EF 55-60%, nl RV fxn. Triv MR.  ? HLD (hyperlipidemia)   ? Hypertension   ? OSA (obstructive sleep apnea)   ? Paroxysmal atrial flutter (HCC)   ? a. Dx 05/2020-->converted w/ oral metoprolol. CHA2DS2VASc = 2-->eliquis.  ? ? ?Past Surgical History:  ?Procedure Laterality Date  ? HERNIA REPAIR    ? ? ?Current Medications: ?Current Meds  ?Medication Sig  ? apixaban (ELIQUIS) 5 MG TABS tablet Take 1 tablet by mouth twice daily  ? fenofibrate micronized (LOFIBRA) 200 MG capsule Take 200 mg by mouth daily.  ? lisinopril (ZESTRIL) 10 MG tablet Take 10 mg by mouth daily.  ? metoprolol succinate (TOPROL-XL) 100 MG 24 hr tablet Take 1.5 tablets (150 mg total) by mouth daily. Take with or immediately following a meal.  ? sildenafil (VIAGRA) 50 MG tablet Take 50 mg by mouth at bedtime.  ? ? ?Allergies:   Patient has no known allergies.  ? ?Social History  ? ?Socioeconomic History  ? Marital status: Married  ?  Spouse name: Not on file  ? Number of children: Not on file  ? Years of education: Not on file  ? Highest education level: Not on file  ?Occupational History  ? Not on file  ?Tobacco Use  ? Smoking status: Former  ?  Types: Cigarettes  ?  Quit date: 04/26/2016  ?  Years since quitting: 5.5  ? Smokeless tobacco: Never  ?  Vaping Use  ? Vaping Use: Never used  ?Substance and Sexual Activity  ? Alcohol use: Yes  ? Drug use: Never  ? Sexual activity: Not on file  ?Other Topics Concern  ? Not on file  ?Social History Narrative  ? Not on file  ? ?Social Determinants of Health  ? ?Financial Resource Strain: Not on file  ?Food Insecurity: Not on file  ?Transportation Needs: Not on file  ?Physical Activity: Not on file  ?Stress: Not on file  ?Social Connections: Not on file  ?  ? ?Family History:  ?The patient's family history is negative for Prostate cancer, Bladder Cancer, and Kidney cancer. ? ?ROS:   ?12-point review of systems is negative unless otherwise noted in the  HPI. ? ? ?EKGs/Labs/Other Studies Reviewed:   ? ?Studies reviewed were summarized above. The additional studies were reviewed today: ? ?2D echo 06/05/2020: ?1. Left ventricular ejection fraction, by estimation, is 55 to 60%. The  ?left ventricle has normal function. Left ventricular endocardial border  ?not optimally defined to evaluate regional wall motion. Left ventricular  ?diastolic parameters are  ?indeterminate.  ? 2. Right ventricular systolic function is normal. The right ventricular  ?size is not well visualized.  ? 3. The mitral valve is degenerative. Trivial mitral valve regurgitation.  ?No evidence of mitral stenosis.  ? 4. The aortic valve was not well visualized. Aortic valve regurgitation  ?is not visualized. No aortic stenosis is present.  ? 5. The inferior vena cava is normal in size with greater than 50%  ?respiratory variability, suggesting right atrial pressure of 3 mmHg. ? ? ? ?EKG:  EKG is ordered today.  The EKG ordered today demonstrates NSR, 62 bpm, early repolarization abnormality, no acute ST-T changes, no significant change when compared to prior tracing ? ?Recent Labs: ?No results found for requested labs within last 8760 hours.  ?Recent Lipid Panel ?No results found for: CHOL, TRIG, HDL, CHOLHDL, VLDL, LDLCALC, LDLDIRECT ? ?PHYSICAL EXAM:   ? ?VS:  BP (!) 150/84 (BP Location: Right Arm, Patient Position: Sitting, Cuff Size: Large)   Pulse 62   Ht 5\' 9"  (1.753 m)   Wt 273 lb 2 oz (123.9 kg)   SpO2 98%   BMI 40.33 kg/m?   BMI: Body mass index is 40.33 kg/m?. ? ?Physical Exam ?Vitals reviewed.  ?Constitutional:   ?   Appearance: He is well-developed.  ?HENT:  ?   Head: Normocephalic and atraumatic.  ?Eyes:  ?   General:     ?   Right eye: No discharge.     ?   Left eye: No discharge.  ?Neck:  ?   Vascular: No JVD.  ?Cardiovascular:  ?   Rate and Rhythm: Normal rate and regular rhythm.  ?   Pulses:     ?     Posterior tibial pulses are 2+ on the right side and 2+ on the left side.  ?    Heart sounds: Normal heart sounds, S1 normal and S2 normal. Heart sounds not distant. No midsystolic click and no opening snap. No murmur heard. ?  No friction rub.  ?Pulmonary:  ?   Effort: Pulmonary effort is normal. No respiratory distress.  ?   Breath sounds: Normal breath sounds. No decreased breath sounds, wheezing or rales.  ?Chest:  ?   Chest wall: No tenderness.  ?Abdominal:  ?   General: There is no distension.  ?   Palpations: Abdomen is soft.  ?   Tenderness: There  is no abdominal tenderness.  ?Musculoskeletal:  ?   Cervical back: Normal range of motion.  ?   Right lower leg: No edema.  ?   Left lower leg: No edema.  ?Skin: ?   General: Skin is warm and dry.  ?   Nails: There is no clubbing.  ?Neurological:  ?   Mental Status: He is alert and oriented to person, place, and time.  ?Psychiatric:     ?   Speech: Speech normal.     ?   Behavior: Behavior normal.     ?   Thought Content: Thought content normal.     ?   Judgment: Judgment normal.  ? ? ?Wt Readings from Last 3 Encounters:  ?11/07/21 273 lb 2 oz (123.9 kg)  ?11/15/20 260 lb 2 oz (118 kg)  ?07/12/20 264 lb 4 oz (119.9 kg)  ?  ? ?ASSESSMENT & PLAN:  ? ?Atrial flutter: Maintaining sinus rhythm and without any symptoms concerning for recurrence of arrhythmia.  Continue Toprol-XL and apixaban.  CHA2DS2-VASc at least 2 (HTN, age).  Recent blood work in 08/2021 showed hemoglobin along with stable electrolytes and renal function.  No symptoms concerning for bleeding ? ?PVCs: Quiescent.  He remains on metoprolol as outlined above. ? ?HTN: Blood pressure is mildly elevated at triage.  He reports a history of whitecoat hypertension with blood pressure typically well controlled.  Continue Toprol-XL and lisinopril.  Continue to monitor. ? ?Left shoulder injury: He is pending an MRI.  He is uncertain if he will require surgical repair or not.  Should he require surgical repair, he would be at low risk for noncardiac surgery and may proceed without further  cardiac intervention or testing.  Apixaban could be held for 2 days prior to surgery with recommendation for this to be resumed as soon as safely possible under the direction of the operative team. ? ? ? ?Disposition: F/u

## 2021-11-07 NOTE — Patient Instructions (Signed)
Medication Instructions:  No changes at this time.   *If you need a refill on your cardiac medications before your next appointment, please call your pharmacy*   Lab Work: None  If you have labs (blood work) drawn today and your tests are completely normal, you will receive your results only by: MyChart Message (if you have MyChart) OR A paper copy in the mail If you have any lab test that is abnormal or we need to change your treatment, we will call you to review the results.   Testing/Procedures: None   Follow-Up: At [facility], you and your health needs are our priority.  As part of our continuing mission to provide you with exceptional heart care, we have created designated Provider Care Teams.  These Care Teams include your primary Cardiologist (physician) and Advanced Practice Providers (APPs -  Physician Assistants and Nurse Practitioners) who all work together to provide you with the care you need, when you need it.   Your next appointment:   1 year(s)  The format for your next appointment:   In Person  Provider:   Christopher End, MD or Ryan Dunn, PA-C      Important Information About Sugar       

## 2021-11-09 ENCOUNTER — Other Ambulatory Visit: Payer: Self-pay | Admitting: Internal Medicine

## 2021-11-18 ENCOUNTER — Ambulatory Visit
Admission: RE | Admit: 2021-11-18 | Discharge: 2021-11-18 | Disposition: A | Discharge status: Home / Self Care | Payer: BC Managed Care – PPO | Source: Ambulatory Visit | Attending: Orthopedic Surgery | Admitting: Orthopedic Surgery

## 2021-11-18 DIAGNOSIS — S46012A Strain of muscle(s) and tendon(s) of the rotator cuff of left shoulder, initial encounter: Secondary | ICD-10-CM

## 2021-11-21 DIAGNOSIS — S46012A Strain of muscle(s) and tendon(s) of the rotator cuff of left shoulder, initial encounter: Secondary | ICD-10-CM | POA: Diagnosis not present

## 2021-11-21 DIAGNOSIS — I10 Essential (primary) hypertension: Secondary | ICD-10-CM | POA: Diagnosis not present

## 2021-12-19 DIAGNOSIS — S46012A Strain of muscle(s) and tendon(s) of the rotator cuff of left shoulder, initial encounter: Secondary | ICD-10-CM | POA: Diagnosis not present

## 2021-12-26 DIAGNOSIS — R7303 Prediabetes: Secondary | ICD-10-CM | POA: Diagnosis not present

## 2021-12-26 DIAGNOSIS — I10 Essential (primary) hypertension: Secondary | ICD-10-CM | POA: Diagnosis not present

## 2021-12-26 DIAGNOSIS — Z862 Personal history of diseases of the blood and blood-forming organs and certain disorders involving the immune mechanism: Secondary | ICD-10-CM | POA: Diagnosis not present

## 2021-12-26 DIAGNOSIS — E781 Pure hyperglyceridemia: Secondary | ICD-10-CM | POA: Diagnosis not present

## 2022-01-02 DIAGNOSIS — I10 Essential (primary) hypertension: Secondary | ICD-10-CM | POA: Diagnosis not present

## 2022-01-02 DIAGNOSIS — R7303 Prediabetes: Secondary | ICD-10-CM | POA: Diagnosis not present

## 2022-01-02 DIAGNOSIS — G4733 Obstructive sleep apnea (adult) (pediatric): Secondary | ICD-10-CM | POA: Diagnosis not present

## 2022-01-02 DIAGNOSIS — Z125 Encounter for screening for malignant neoplasm of prostate: Secondary | ICD-10-CM | POA: Diagnosis not present

## 2022-01-02 DIAGNOSIS — F5101 Primary insomnia: Secondary | ICD-10-CM | POA: Diagnosis not present

## 2022-01-02 DIAGNOSIS — E781 Pure hyperglyceridemia: Secondary | ICD-10-CM | POA: Diagnosis not present

## 2022-01-02 DIAGNOSIS — Z9989 Dependence on other enabling machines and devices: Secondary | ICD-10-CM | POA: Diagnosis not present

## 2022-01-15 DIAGNOSIS — S46912A Strain of unspecified muscle, fascia and tendon at shoulder and upper arm level, left arm, initial encounter: Secondary | ICD-10-CM | POA: Diagnosis not present

## 2022-01-16 ENCOUNTER — Other Ambulatory Visit: Payer: Self-pay | Admitting: Internal Medicine

## 2022-01-16 NOTE — Telephone Encounter (Signed)
Prescription refill request for Eliquis received. Indication: PAF Last office visit: 11/07/21  R Dunn PA-C Scr: 1.1 on 12/26/21 Age:  71 Weight: 123.9kg  Based on above findings Eliquis 5mg  twice daily is the appropriate dose.  Refill approved.

## 2022-05-20 DIAGNOSIS — J209 Acute bronchitis, unspecified: Secondary | ICD-10-CM | POA: Diagnosis not present

## 2022-05-20 DIAGNOSIS — Z03818 Encounter for observation for suspected exposure to other biological agents ruled out: Secondary | ICD-10-CM | POA: Diagnosis not present

## 2022-07-14 DIAGNOSIS — I10 Essential (primary) hypertension: Secondary | ICD-10-CM | POA: Diagnosis not present

## 2022-07-14 DIAGNOSIS — R7303 Prediabetes: Secondary | ICD-10-CM | POA: Diagnosis not present

## 2022-07-14 DIAGNOSIS — E781 Pure hyperglyceridemia: Secondary | ICD-10-CM | POA: Diagnosis not present

## 2022-07-14 DIAGNOSIS — Z125 Encounter for screening for malignant neoplasm of prostate: Secondary | ICD-10-CM | POA: Diagnosis not present

## 2022-07-15 DIAGNOSIS — R7303 Prediabetes: Secondary | ICD-10-CM | POA: Diagnosis not present

## 2022-07-15 DIAGNOSIS — E781 Pure hyperglyceridemia: Secondary | ICD-10-CM | POA: Diagnosis not present

## 2022-07-15 DIAGNOSIS — Z0000 Encounter for general adult medical examination without abnormal findings: Secondary | ICD-10-CM | POA: Diagnosis not present

## 2022-07-15 DIAGNOSIS — I10 Essential (primary) hypertension: Secondary | ICD-10-CM | POA: Diagnosis not present

## 2022-12-09 ENCOUNTER — Other Ambulatory Visit: Payer: Self-pay | Admitting: Internal Medicine

## 2022-12-09 NOTE — Telephone Encounter (Signed)
Please schedule overdue office visit for further refills. Thank you! 

## 2022-12-11 NOTE — Telephone Encounter (Signed)
Left voicemail to schedule follow up appt

## 2023-01-20 DIAGNOSIS — I10 Essential (primary) hypertension: Secondary | ICD-10-CM | POA: Diagnosis not present

## 2023-01-20 DIAGNOSIS — R7303 Prediabetes: Secondary | ICD-10-CM | POA: Diagnosis not present

## 2023-01-20 DIAGNOSIS — E781 Pure hyperglyceridemia: Secondary | ICD-10-CM | POA: Diagnosis not present

## 2023-01-27 DIAGNOSIS — E781 Pure hyperglyceridemia: Secondary | ICD-10-CM | POA: Diagnosis not present

## 2023-01-27 DIAGNOSIS — R7303 Prediabetes: Secondary | ICD-10-CM | POA: Diagnosis not present

## 2023-01-27 DIAGNOSIS — M25572 Pain in left ankle and joints of left foot: Secondary | ICD-10-CM | POA: Diagnosis not present

## 2023-01-27 DIAGNOSIS — G4733 Obstructive sleep apnea (adult) (pediatric): Secondary | ICD-10-CM | POA: Diagnosis not present

## 2023-01-27 DIAGNOSIS — F5101 Primary insomnia: Secondary | ICD-10-CM | POA: Diagnosis not present

## 2023-01-27 DIAGNOSIS — I10 Essential (primary) hypertension: Secondary | ICD-10-CM | POA: Diagnosis not present

## 2023-01-27 DIAGNOSIS — I4892 Unspecified atrial flutter: Secondary | ICD-10-CM | POA: Diagnosis not present

## 2023-01-30 ENCOUNTER — Ambulatory Visit (INDEPENDENT_AMBULATORY_CARE_PROVIDER_SITE_OTHER): Payer: BC Managed Care – PPO | Admitting: Urology

## 2023-01-30 VITALS — BP 137/95 | HR 69 | Ht 69.0 in | Wt 250.0 lb

## 2023-01-30 DIAGNOSIS — N5201 Erectile dysfunction due to arterial insufficiency: Secondary | ICD-10-CM

## 2023-01-30 DIAGNOSIS — N529 Male erectile dysfunction, unspecified: Secondary | ICD-10-CM

## 2023-01-30 MED ORDER — AMBULATORY NON FORMULARY MEDICATION: 0 refills | Status: DC

## 2023-01-30 NOTE — Patient Instructions (Signed)
[email or web address] for information on penile injections.

## 2023-01-30 NOTE — Progress Notes (Signed)
I, Maysun Anabel Bene, acting as a scribe for Riki Altes, MD., have documented all relevant documentation on the behalf of Riki Altes, MD, as directed by Riki Altes, MD while in the presence of Riki Altes, MD.  01/30/2023 10:27 AM   Peter Hartman 1951/05/16 [id number]  Referring provider: Marisue Ivan, MD [phone] [facility] [address] [facility] [place],  [place] [address]  Chief Complaint  Patient presents with   Erectile Dysfunction    HPI: Peter Hartman is a 72 y.o. male presents for a reestablished visit for erectile dysfunction.   Saw Dr. Mena Goes in 2020 for erectile dysfunction. He was on sildenafil 50 mg, which was not effective and was titrated to 100 mg with only partial erections, not firm enough for penetration.  He also tried tadalafil, which was not effective.  Presents for a follow-up visit to discuss other management options.  Sexual Health Inventory for Men's score was 1/25 indicating severe ED.   PMH: Past Medical History:  Diagnosis Date   ETOH abuse    History of echocardiogram    a. 05/2020 Echo: EF 55-60%, nl RV fxn. Triv MR.   HLD (hyperlipidemia)    Hypertension    OSA (obstructive sleep apnea)    Paroxysmal atrial flutter (HCC)    a. Dx 05/2020-->converted w/ oral metoprolol. CHA2DS2VASc = 2-->eliquis.    Surgical History: Past Surgical History:  Procedure Laterality Date   HERNIA REPAIR      Home Medications:  Allergies as of 01/30/2023   No Known Allergies      Medication List        Accurate as of January 30, 2023 10:27 AM. If you have any questions, ask your nurse or doctor.          ALPRAZolam 0.5 MG tablet Commonly known as: XANAX Take 0.5 mg by mouth daily.   AMBULATORY NON FORMULARY MEDICATION Trimix (30/1/10)-(Pap/Phent/PGE)  Test Dose  1ml vial   Qty #3 Refills 0  [facility] [phone] Fax [phone] Started by: Riki Altes   benzonatate 200 MG capsule Commonly  known as: TESSALON Take 200 mg by mouth 3 (three) times daily as needed for cough.   diphenhydrAMINE 25 MG tablet Commonly known as: BENADRYL Take 25 mg by mouth as needed.   Eliquis 5 MG Tabs tablet Generic drug: apixaban Take 1 tablet by mouth twice daily   fenofibrate micronized 200 MG capsule Commonly known as: LOFIBRA Take 200 mg by mouth daily.   lisinopril 10 MG tablet Commonly known as: ZESTRIL Take 10 mg by mouth daily.   metoprolol succinate 100 MG 24 hr tablet Commonly known as: TOPROL-XL TAKE ONE AND ONE-HALF TABLETS BY MOUTH ONCE DAILY WITH  OR  IMMEDIATELY  FOLLOWING  A  MEAL   sildenafil 50 MG tablet Commonly known as: VIAGRA Take 50 mg by mouth at bedtime.        Allergies: No Known Allergies  Family History: Family History  Problem Relation Age of Onset   Prostate cancer Neg Hx    Bladder Cancer Neg Hx    Kidney cancer Neg Hx     Social History:  reports that he quit smoking about 6 years ago. His smoking use included cigarettes. He has never used smokeless tobacco. He reports current alcohol use. He reports that he does not use drugs.   Physical Exam: BP (!) 137/95   Pulse 69   Ht 5\' 9"  (1.753 m)   Wt  250 lb (113.4 kg)   BMI 36.92 kg/m   Constitutional:  Alert and oriented, No acute distress. HEENT: [place] AT, moist mucus membranes.  Trachea midline, no masses. Cardiovascular: No clubbing, cyanosis, or edema. Respiratory: Normal respiratory effort, no increased work of breathing. GI: Abdomen is soft, nontender, nondistended, no abdominal masses Skin: No rashes, bruises or suspicious lesions. Neurologic: Grossly intact, no focal deficits, moving all 4 extremities. Psychiatric: Normal mood and affect.   Assessment & Plan:    1. Erectile dysfunction PDE5 inhibitor refractory We discussed intracavernosal injections with potential risk of priapism and corporal scarring.  Vacuum erection devices and penile implant surgery were also discussed.   He is interested in pursuing intracavinosal injections, Rx test dose Trimex was sent to [facility].  He was scheduled for a PA appointment for injection training. He was given the edex website to review the technique for injection.  [facility] [address] [place], [place] [address] [phone]

## 2023-02-01 ENCOUNTER — Encounter: Payer: Self-pay | Admitting: Urology

## 2023-02-04 ENCOUNTER — Other Ambulatory Visit: Payer: Self-pay

## 2023-02-04 ENCOUNTER — Telehealth: Payer: Self-pay | Admitting: Internal Medicine

## 2023-02-04 MED ORDER — METOPROLOL SUCCINATE ER 100 MG PO TB24: ORAL_TABLET | ORAL | 1 refills | Status: DC

## 2023-02-04 MED ORDER — APIXABAN 5 MG PO TABS: 5.0000 mg | ORAL_TABLET | Freq: Two times a day (BID) | ORAL | 5 refills | Status: DC

## 2023-02-04 NOTE — Telephone Encounter (Signed)
*  STAT* If patient is at the pharmacy, call can be transferred to refill team.   1. Which medications need to be refilled? (please list name of each medication and dose if known)   apixaban (ELIQUIS) 5 MG TABS tablet     2. Which pharmacy/location (including street and city if local pharmacy) is medication to be sent to? [facility] - [place] ([place]), [place] - [address]  3. Do they need a 30 day or 90 day supply?  30 day

## 2023-02-04 NOTE — Telephone Encounter (Signed)
Requested Prescriptions   Signed Prescriptions Disp Refills   metoprolol succinate (TOPROL-XL) 100 MG 24 hr tablet 45 tablet 1    Sig: Take with or immediately following a meal.TAKE ONE AND ONE-HALF TABLETS BY MOUTH ONCE DAILY WITH  OR  IMMEDIATELY  FOLLOWING  A  MEAL    Authorizing Provider: END, CHRISTOPHER    Ordering User: Guerry Minors

## 2023-02-04 NOTE — Telephone Encounter (Signed)
*  STAT* If patient is at the pharmacy, call can be transferred to refill team.   1. Which medications need to be refilled? (please list name of each medication and dose if known)   metoprolol succinate (TOPROL-XL) 100 MG 24 hr tablet    2. Which pharmacy/location (including street and city if local pharmacy) is medication to be sent to? [facility] - [place] ([address]), [place] - [address]    3. Do they need a 30 day or 90 day supply? 30 day

## 2023-02-04 NOTE — Telephone Encounter (Signed)
Refill request

## 2023-02-04 NOTE — Telephone Encounter (Signed)
Prescription refill request for Eliquis received. Indication:aflutter Last office visit:upcoming Scr:1.0  7/24 Age: 72 Weight:113.4  kg  Prescription refilled

## 2023-02-18 NOTE — Progress Notes (Unsigned)
02/19/2023 9:54 AM  Peter Hartman 1951/03/02 [id number]   Referring provider: Marisue Ivan, MD [phone] [facility] [address] [facility] [place],  [place] [address]   Urological history: 1.  Erectile dysfunction -Contributing factors of age, BPH, alcohol abuse, hyperlipidemia, hypertension, sleep apnea, heart disease, tobacco abuse, diabetes and obesity -Failed PDE 5 inhibitors  2. BPH with LU TS -PSA (06/2022) 1.20   Chief Complaint  Patient presents with   Erectile Dysfunction   HPI: Peter Hartman is a 72 y.o. male who presents today for Trimix titration.    The procedure is discussed with patient.  He is allowed to ask questions.  Questions were answered to his satisfaction.  We were able to proceed to the titration.  Physical Exam:  BP (!) 141/88   Pulse 60   Ht 5\' 9"  (1.753 m)   Wt 250 lb (113.4 kg)   BMI 36.92 kg/m   Constitutional:  Well nourished. Alert and oriented, No acute distress. GU: No CVA tenderness.  No bladder fullness or masses.  Patient with uncircumcised phallus.  Foreskin easily retracted  Urethral meatus is patent.  No penile discharge. No penile lesions or rashes. Scrotum without lesions, cysts, rashes and/or edema.   Psychiatric: Normal mood and affect.   Procedure  Patient's left corpus cavernosum is identified.  An area near the base of the penis is cleansed with rubbing alcohol.  Careful to avoid the dorsal vein, 2 mcg of Trimix (papaverine 30 mg, phentolamine 1 mg and prostaglandin E1 10 mcg, Lot # [id number]EGBTDVVO$  exp # [id number] is injected at a 90 degree angle into the left  corpus cavernosum near the base of the penis.  Patient experienced a semi firm erection in 15 minutes.    Patient's right corpus cavernosum is identified.  An area near the base of the penis is cleansed with rubbing alcohol.  Careful to avoid the dorsal vein, 2 mcg of Trimix (papaverine 30 mg, phentolamine 1 mg and prostaglandin E1 10 mcg, Lot # [id number]$$[id number]$  exp #  [id number] is injected at a 90 degree angle into the right corpus cavernosum near the base of the penis.  Patient experienced a semi firm erection in 15 minutes.    Patient's left corpus cavernosum is identified.  An area near the base of the penis is cleansed with rubbing alcohol.  Careful to avoid the dorsal vein, 2 mcg of Trimix (papaverine 30 mg, phentolamine 1 mg and prostaglandin E1 10 mcg, Lot # [id number]$$IFOYDXAJOINOMVEH_MCNOBSJGGEZMOQHUTMLYYTKPTWSFKCLE$  exp # [id number] is injected at a 90 degree angle into the left corpus cavernosum near the base of the penis.  Patient experienced a semi firm erection in 15 minutes.    Assessment & Plan:    1.  Erectile dysfunction -he decided to stop the titration at this point as he feels he would have more success in a more romantic environment, he states he feels comfortable injecting the medication -Advised him to start with 0.4 of the Trimix and then report to me its effect Advised patient of the condition of priapism, painful erection lasting for more than four hours, and to contact the office or seek treatment in the ED immediately    Return for patient to call for follow up .  Cloretta Ned   [facility] [address] [place], [place] [address] [phone]   I spent 15 minutes on the day of the encounter to include pre-visit record review, face-to-face time with the patient, and post-visit  ordering of tests.

## 2023-02-19 ENCOUNTER — Encounter: Payer: Self-pay | Admitting: Urology

## 2023-02-19 ENCOUNTER — Ambulatory Visit (INDEPENDENT_AMBULATORY_CARE_PROVIDER_SITE_OTHER): Payer: BC Managed Care – PPO | Admitting: Urology

## 2023-02-19 VITALS — BP 141/88 | HR 60 | Ht 69.0 in | Wt 250.0 lb

## 2023-02-19 DIAGNOSIS — N5201 Erectile dysfunction due to arterial insufficiency: Secondary | ICD-10-CM | POA: Diagnosis not present

## 2023-02-19 NOTE — Patient Instructions (Signed)
TRIMIX SELF-INJECTION INSTRUCTIONS   Inject 0.4 of the medication    DETAILED PROCEDURE  1. GETTING SET UP  A. Proper hygiene is important. Wash your hands and keep the penis clean.  B. Assemble the following:  - Bottle of Trimix  - Alcohol pad  - Syringe  C. Keep the Trimix cold by returning the bottle to the refrigerator, or by placing the bottle in a cup of ice.   2. PREPARE THE SYRINGE  A. Wipe the rubber top of the vial with an alcohol pad.  B. After removing the cap of the needle, pull the plunger back to the desired dosage, filling this volume with air. Use a new needle and syringe each time.  C. Insert the needle through the rubber top and inject the air into the vial.  D. Turn the vial with needle and syringe inserted upside down. Pull back on the syringe plunger in a slow and steady motion until the desired dosage is achieved.  E. Tap the side of the syringe (1cc tuberculin syringe with a 29 gauge needle) to allow any air bubbles to float towards the needle. Avoid having these air bubbles in the syringe when self-injecting by first injecting out the collected bubbles that may form.  F. Remove the needle from the bottle and replace the protective cap on the needle.    3. SELECT AND PREPARE THE SITE FOR INJECTION  A. The proper location for injection is at the 9-11 and 1-3 o'clock positions, between the base and mid-portion of the penis.(see diagram) Avoid the midline because of potential for injury to the urethra (6 o'clock; for urinary passage) and the penile arteries and nerves (near 12 o'clock). Avoid any visible veins or arteries on the surface.  B. Grasp and pull the head of the penis toward the side of your leg with the index finger and thumb (use the left hand, if right handed). While maintaining light tension, select a site for injection.  C. Clean the site with an alcohol pad.   4: INJECT TRIMIX AND APPLY COMPRESSION  A. With a steady and continuous motion, penetrate the  skin with the needle at a 90 o angle. The needle should then be advanced to the hub. Slight resistance is encountered as the needle passes into the proper position within the erectile tissue (corporeal body).  B. Inject the Trimix over approximately 4 seconds. Withdraw the needle from the penis and apply compression to the injection site for approximately 1 minute. Several minutes of compression may be required to avoid bleeding, especially if you are an aspirin user.  C. Replace the cap on the needle and dispose of properly.   If you experience a painful erection that will not go down, take four (30 mg) tablets of pseudoephedrine (Sudafed-not the extended release) and if the erection does not go down in the next hour or increases in pain, contact the office immediately or seek treatment in the ED

## 2023-03-05 NOTE — Progress Notes (Deleted)
Cardiology Office Note    Date:  03/05/2023   ID:  Peter Hartman, DOB 07-21-1950, MRN [id number]  PCP:  Marisue Ivan, MD  Cardiologist:  Yvonne Kendall, MD  Electrophysiologist:  None   Chief Complaint: Follow-up  History of Present Illness:   Peter Hartman is a 72 y.o. male with history of atrial flutter on apixaban, HTN, hypertriglyceridemia, obesity, and OSA who presents for follow-up of atrial flutter.   He was admitted to the hospital in 05/2020 with new onset atrial flutter with RVR and converted on oral metoprolol.  It was assumed he was having paroxysms of atrial flutter at home.  Echo in 05/2020 demonstrated an EF of 55 to 60%, normal RV systolic function, trivial mitral regurgitation, and an estimated right atrial pressure of 3 mmHg.  Toprol has previously been titrated due to elevated BP and PVCs.  He was most recently seen in the office in 10/2021 and was without symptoms of angina or cardiac decompensation.  He was adherent and tolerating cardiac medications.  No changes were indicated at that time.  ***   Labs independently reviewed: 12/2022 - potassium 4.3, BUN 10, serum creatinine 1.0, albumin 3.7, AST/ALT normal, A1c 5.8, TC 160, TG 100, HDL 63, LDL 76, Hgb 13.7, PLT 263 0/8657 - A1c 6.0 05/2020 - TSH normal  Past Medical History:  Diagnosis Date   ETOH abuse    History of echocardiogram    a. 05/2020 Echo: EF 55-60%, nl RV fxn. Triv MR.   HLD (hyperlipidemia)    Hypertension    OSA (obstructive sleep apnea)    Paroxysmal atrial flutter (HCC)    a. Dx 05/2020-->converted w/ oral metoprolol. CHA2DS2VASc = 2-->eliquis.    Past Surgical History:  Procedure Laterality Date   HERNIA REPAIR      Current Medications: No outpatient medications have been marked as taking for the 03/06/23 encounter (Appointment) with Sondra Barges, PA-C.    Allergies:   Patient has no known allergies.   Social History   Socioeconomic History   Marital status: Married     Spouse name: Not on file   Number of children: Not on file   Years of education: Not on file   Highest education level: Not on file  Occupational History   Not on file  Tobacco Use   Smoking status: Former    Current packs/day: 0.00    Types: Cigarettes    Quit date: 04/26/2016    Years since quitting: 6.8   Smokeless tobacco: Never  Vaping Use   Vaping status: Never Used  Substance and Sexual Activity   Alcohol use: Yes   Drug use: Never   Sexual activity: Not on file  Other Topics Concern   Not on file  Social History Narrative   Not on file   Social Determinants of Health   Financial Resource Strain: Low Risk  (01/27/2023)   Received from [facility]   Overall Financial Resource Strain (CARDIA)    Difficulty of Paying Living Expenses: Not hard at all  Food Insecurity: No Food Insecurity (01/27/2023)   Received from [facility]   Hunger Vital Sign    Worried About Running Out of Food in the Last Year: Never true    Ran Out of Food in the Last Year: Never true  Transportation Needs: No Transportation Needs (01/27/2023)   Received from [facility] - Transportation    In the past 12 months,  has lack of transportation kept you from medical appointments or from getting medications?: No    Lack of Transportation (Non-Medical): No  Physical Activity: Not on file  Stress: Not on file  Social Connections: Not on file     Family History:  The patient's family history is negative for Prostate cancer, Bladder Cancer, and Kidney cancer.  ROS:   12-point review of systems is negative unless otherwise noted in the HPI.   EKGs/Labs/Other Studies Reviewed:    Studies reviewed were summarized above. The additional studies were reviewed today:  2D echo 06/05/2020: 1. Left ventricular ejection fraction, by estimation, is 55 to 60%. The  left ventricle has normal function. Left ventricular endocardial border  not  optimally defined to evaluate regional wall motion. Left ventricular  diastolic parameters are  indeterminate.   2. Right ventricular systolic function is normal. The right ventricular  size is not well visualized.   3. The mitral valve is degenerative. Trivial mitral valve regurgitation.  No evidence of mitral stenosis.   4. The aortic valve was not well visualized. Aortic valve regurgitation  is not visualized. No aortic stenosis is present.   5. The inferior vena cava is normal in size with greater than 50%  respiratory variability, suggesting right atrial pressure of 3 mmHg.   EKG:  EKG is ordered today.  The EKG ordered today demonstrates ***  Recent Labs: No results found for requested labs within last 365 days.  Recent Lipid Panel No results found for: "CHOL", "TRIG", "HDL", "CHOLHDL", "VLDL", "LDLCALC", "LDLDIRECT"  PHYSICAL EXAM:    VS:  There were no vitals taken for this visit.  BMI: There is no height or weight on file to calculate BMI.  Physical Exam  Wt Readings from Last 3 Encounters:  02/19/23 250 lb (113.4 kg)  01/30/23 250 lb (113.4 kg)  11/07/21 273 lb 2 oz (123.9 kg)     ASSESSMENT & PLAN:   Atrial flutter: ***.  CHA2DS2-VASc at least 2 (HTN, age).  PVCs:  HTN: Blood pressure  Hypertriglyceridemia:   {Are you ordering a CV Procedure (e.g. stress test, cath, DCCV, TEE, etc)?   Press F2        :[id number]}     Disposition: F/u with Dr. Okey Dupre or an APP in ***.   Medication Adjustments/Labs and Tests Ordered: Current medicines are reviewed at length with the patient today.  Concerns regarding medicines are outlined above. Medication changes, Labs and Tests ordered today are summarized above and listed in the Patient Instructions accessible in Encounters.   SignedEula Listen, PA-C 03/05/2023 10:02 AM     [facility] -  [address] [place], [place] [address] [phone]

## 2023-03-06 ENCOUNTER — Ambulatory Visit: Payer: Medicare HMO | Admitting: Physician Assistant

## 2023-04-06 NOTE — Progress Notes (Deleted)
Cardiology Office Note    Date:  04/06/2023   ID:  Peter Hartman, DOB [birth year], MRN [id number]  PCP:  Peter Ivan, MD  Cardiologist:  Peter Kendall, MD  Electrophysiologist:  None   Chief Complaint: Follow up  History of Present Illness:   Peter Hartman is a 72 y.o. male with history of atrial flutter on apixaban, HTN, hypertriglyceridemia, obesity, and OSA who presents for follow-up of atrial flutter.   He was admitted to the hospital in 05/2020 with new onset atrial flutter with RVR and converted on oral metoprolol.  It was assumed he was having paroxysms of atrial flutter at home.  Echo in 05/2020 demonstrated an EF of 55 to 60%, normal RV systolic function, trivial mitral regurgitation, and an estimated right atrial pressure of 3 mmHg.  Toprol has previously been titrated due to elevated BP and PVCs.  He was most recently seen in the office in 10/2021, and was without symptoms of angina or cardiac decompensation.  ***   Labs independently reviewed: 12/2022 - potassium 4.3, BUN 10, serum creatinine 1.0, albumin 3.7, AST/ALT normal, A1c 5.8, TC 160, TG 100, HDL 63, LDL 76, Hgb 13.7, PLT 263 41/6606 - TSH normal  Past Medical History:  Diagnosis Date   ETOH abuse    History of echocardiogram    a. 05/2020 Echo: EF 55-60%, nl RV fxn. Triv MR.   HLD (hyperlipidemia)    Hypertension    OSA (obstructive sleep apnea)    Paroxysmal atrial flutter (HCC)    a. Dx 05/2020-->converted w/ oral metoprolol. CHA2DS2VASc = 2-->eliquis.    Past Surgical History:  Procedure Laterality Date   HERNIA REPAIR      Current Medications: No outpatient medications have been marked as taking for the 04/07/23 encounter (Appointment) with Sondra Barges, PA-C.    Allergies:   Patient has no known allergies.   Social History   Socioeconomic History   Marital status: Married    Spouse name: Not on file   Number of children: Not on file   Years of education: Not on file   Highest  education level: Not on file  Occupational History   Not on file  Tobacco Use   Smoking status: Former    Current packs/day: 0.00    Types: Cigarettes    Quit date: 04/26/2016    Years since quitting: 6.9   Smokeless tobacco: Never  Vaping Use   Vaping status: Never Used  Substance and Sexual Activity   Alcohol use: Yes   Drug use: Never   Sexual activity: Not on file  Other Topics Concern   Not on file  Social History Narrative   Not on file   Social Determinants of Health   Financial Resource Strain: Low Risk  (01/27/2023)   Received from [facility]   Overall Financial Resource Strain (CARDIA)    Difficulty of Paying Living Expenses: Not hard at all  Food Insecurity: No Food Insecurity (01/27/2023)   Received from [facility]   Hunger Vital Sign    Worried About Running Out of Food in the Last Year: Never true    Ran Out of Food in the Last Year: Never true  Transportation Needs: No Transportation Needs (01/27/2023)   Received from [facility] - Transportation    In the past 12 months, has lack of transportation kept you from medical appointments or from getting medications?: No    Lack of  Transportation (Non-Medical): No  Physical Activity: Not on file  Stress: Not on file  Social Connections: Not on file     Family History:  The patient's family history is negative for Prostate cancer, Bladder Cancer, and Kidney cancer.  ROS:   12-point review of systems is negative unless otherwise noted in the HPI.   EKGs/Labs/Other Studies Reviewed:    Studies reviewed were summarized above. The additional studies were reviewed today:  2D echo 06/05/2020: 1. Left ventricular ejection fraction, by estimation, is 55 to 60%. The  left ventricle has normal function. Left ventricular endocardial border  not optimally defined to evaluate regional wall motion. Left ventricular  diastolic parameters are   indeterminate.   2. Right ventricular systolic function is normal. The right ventricular  size is not well visualized.   3. The mitral valve is degenerative. Trivial mitral valve regurgitation.  No evidence of mitral stenosis.   4. The aortic valve was not well visualized. Aortic valve regurgitation  is not visualized. No aortic stenosis is present.   5. The inferior vena cava is normal in size with greater than 50%  respiratory variability, suggesting right atrial pressure of 3 mmHg.   EKG:  EKG is ordered today.  The EKG ordered today demonstrates ***  Recent Labs: No results found for requested labs within last 365 days.  Recent Lipid Panel No results found for: "CHOL", "TRIG", "HDL", "CHOLHDL", "VLDL", "LDLCALC", "LDLDIRECT"  PHYSICAL EXAM:    VS:  There were no vitals taken for this visit.  BMI: There is no height or weight on file to calculate BMI.  Physical Exam  Wt Readings from Last 3 Encounters:  02/19/23 250 lb (113.4 kg)  01/30/23 250 lb (113.4 kg)  11/07/21 273 lb 2 oz (123.9 kg)     ASSESSMENT & PLAN:   Atrial flutter: ***.  CHA2DS2-VASc at least *** (HTN, age x 1).  PVCs:  HTN: Blood pressure   {Are you ordering a CV Procedure (e.g. stress test, cath, DCCV, TEE, etc)?   Press F2        :[id number]}     Disposition: F/u with Dr. Okey Dupre or an APP in ***.   Medication Adjustments/Labs and Tests Ordered: Current medicines are reviewed at length with the patient today.  Concerns regarding medicines are outlined above. Medication changes, Labs and Tests ordered today are summarized above and listed in the Patient Instructions accessible in Encounters.   Signed, Eula Listen, PA-C 04/06/2023 1:33 PM     [facility] - [place] [address] [place], [place] [address] [phone]

## 2023-04-07 ENCOUNTER — Ambulatory Visit: Payer: Medicare HMO | Admitting: Physician Assistant

## 2023-05-01 NOTE — Progress Notes (Signed)
Cardiology Office Note    Date:  05/08/2023   ID:  Peter Hartman, DOB 10/12/1950, MRN [id number]  PCP:  Marisue Ivan, MD  Cardiologist:  Yvonne Kendall, MD  Electrophysiologist:  None   Chief Complaint: Follow up  History of Present Illness:   Peter Hartman is a 72 y.o. male with history of atrial flutter on apixaban, HTN, hypertriglyceridemia, obesity, and OSA who presents for follow-up of atrial flutter.   He was admitted to the hospital in 05/2020 with new onset atrial flutter with RVR and converted on oral metoprolol.  It was assumed he was having paroxysms of atrial flutter at home.  Echo in 05/2020 demonstrated an EF of 55 to 60%, normal RV systolic function, trivial mitral regurgitation, and an estimated right atrial pressure of 3 mmHg.  Toprol has previously been titrated due to elevated BP and PVCs.  He was most recently seen in the office in 10/2021, and was without symptoms of angina or cardiac decompensation.  He comes in doing very well from a cardiac perspective and is without symptoms of angina or cardiac decompensation.  No dyspnea, palpitations, dizziness, presyncope, or syncope.  No falls or symptoms concerning for bleeding.  Adherent and tolerating cardiac medications without off target effects.  No lower extremity swelling or progressive orthopnea.  Remains active at baseline, walking 1 mile each morning without cardiac limitation.  Reports blood pressure is typically well-controlled at home.  He does not have any acute cardiac concerns at this time.   Labs independently reviewed: 12/2022 - potassium 4.3, BUN 10, serum creatinine 1.0, albumin 3.7, AST/ALT normal, A1c 5.8, TC 160, TG 100, HDL 63, LDL 76, Hgb 13.7, PLT 263 40/9811 - TSH normal  Past Medical History:  Diagnosis Date   ETOH abuse    History of echocardiogram    a. 05/2020 Echo: EF 55-60%, nl RV fxn. Triv MR.   HLD (hyperlipidemia)    Hypertension    OSA (obstructive sleep apnea)    Paroxysmal  atrial flutter (HCC)    a. Dx 05/2020-->converted w/ oral metoprolol. CHA2DS2VASc = 2-->eliquis.    Past Surgical History:  Procedure Laterality Date   HERNIA REPAIR      Current Medications: Current Meds  Medication Sig   apixaban (ELIQUIS) 5 MG TABS tablet Take 1 tablet (5 mg total) by mouth 2 (two) times daily.   fenofibrate micronized (LOFIBRA) 200 MG capsule Take 200 mg by mouth daily.   lisinopril (ZESTRIL) 10 MG tablet Take 10 mg by mouth daily.   metoprolol succinate (TOPROL-XL) 100 MG 24 hr tablet Take with or immediately following a meal.TAKE ONE AND ONE-HALF TABLETS BY MOUTH ONCE DAILY WITH  OR  IMMEDIATELY  FOLLOWING  A  MEAL   sildenafil (VIAGRA) 50 MG tablet Take 50 mg by mouth at bedtime.   traZODone (DESYREL) 50 MG tablet Take 100 mg by mouth at bedtime as needed.    Allergies:   Patient has no known allergies.   Social History   Socioeconomic History   Marital status: Married    Spouse name: Not on file   Number of children: Not on file   Years of education: Not on file   Highest education level: Not on file  Occupational History   Not on file  Tobacco Use   Smoking status: Former    Current packs/day: 0.00    Types: Cigarettes    Quit date: 04/26/2016    Years since quitting: 7.0   Smokeless tobacco: Never  Vaping Use   Vaping status: Never Used  Substance and Sexual Activity   Alcohol use: Yes   Drug use: Never   Sexual activity: Not on file  Other Topics Concern   Not on file  Social History Narrative   Not on file   Social Determinants of Health   Financial Resource Strain: Low Risk  (01/27/2023)   Received from [facility]   Overall Financial Resource Strain (CARDIA)    Difficulty of Paying Living Expenses: Not hard at all  Food Insecurity: No Food Insecurity (01/27/2023)   [facility]   Hunger Vital Sign    Worried About Running Out of Food in the Last Year: Never true    Ran Out of Food  in the Last Year: Never true  Transportation Needs: No Transportation Needs (01/27/2023)   Received from [facility]    In the past 12 months, has lack of transportation kept you from medical appointments or from getting medications?: No    Lack of Transportation (Non-Medical): No  Physical Activity: Not on file  Stress: Not on file  Social Connections: Not on file     Family History:  The patient's family history is negative for Prostate cancer, Bladder Cancer, and Kidney cancer.  ROS:   12-point review of systems is negative unless otherwise noted in the HPI.   EKGs/Labs/Other Studies Reviewed:    Studies reviewed were summarized above. The additional studies were reviewed today:  2D echo 06/05/2020: 1. Left ventricular ejection fraction, by estimation, is 55 to 60%. The  left ventricle has normal function. Left ventricular endocardial border  not optimally defined to evaluate regional wall motion. Left ventricular  diastolic parameters are  indeterminate.   2. Right ventricular systolic function is normal. The right ventricular  size is not well visualized.   3. The mitral valve is degenerative. Trivial mitral valve regurgitation.  No evidence of mitral stenosis.   4. The aortic valve was not well visualized. Aortic valve regurgitation  is not visualized. No aortic stenosis is present.   5. The inferior vena cava is normal in size with greater than 50%  respiratory variability, suggesting right atrial pressure of 3 mmHg.   EKG:  EKG is ordered today.  The EKG ordered today demonstrates NSR, 67 bpm, early repolarization abnormality, no acute ST-T changes  Recent Labs: No results found for requested labs within last 365 days.  Recent Lipid Panel No results found for: "CHOL", "TRIG", "HDL", "CHOLHDL", "VLDL", "LDLCALC", "LDLDIRECT"  PHYSICAL EXAM:    VS:  BP (!) 146/82 (BP Location: Left Arm, Patient Position: Sitting, Cuff  Size: Normal)   Pulse 67   Ht 5\' 9"  (1.753 m)   Wt 266 lb (120.7 kg)   SpO2 98%   BMI 39.28 kg/m   BMI: Body mass index is 39.28 kg/m.  Physical Exam Vitals reviewed.  Constitutional:      Appearance: He is well-developed.  HENT:     Head: Normocephalic and atraumatic.  Eyes:     General:        Right eye: No discharge.        Left eye: No discharge.  Neck:     Vascular: No JVD.  Cardiovascular:     Rate and Rhythm: Normal rate and regular rhythm.     Pulses:          Posterior tibial pulses are 2+ on the right side and  2+ on the left side.     Heart sounds: Normal heart sounds, S1 normal and S2 normal. Heart sounds not distant. No midsystolic click and no opening snap. No murmur heard.    No friction rub.  Pulmonary:     Effort: Pulmonary effort is normal. No respiratory distress.     Breath sounds: Normal breath sounds. No decreased breath sounds, wheezing, rhonchi or rales.  Chest:     Chest wall: No tenderness.  Abdominal:     General: There is no distension.  Musculoskeletal:     Cervical back: Normal range of motion.     Right lower leg: No edema.     Left lower leg: No edema.  Skin:    General: Skin is warm and dry.     Nails: There is no clubbing.  Neurological:     Mental Status: He is alert and oriented to person, place, and time.  Psychiatric:        Speech: Speech normal.        Behavior: Behavior normal.        Thought Content: Thought content normal.        Judgment: Judgment normal.     Wt Readings from Last 3 Encounters:  05/08/23 266 lb (120.7 kg)  02/19/23 250 lb (113.4 kg)  01/30/23 250 lb (113.4 kg)     ASSESSMENT & PLAN:   Atrial flutter: Maintaining sinus rhythm on Toprol-XL 150 mg.  CHA2DS2-VASc at least 2 (HTN, age x 1).  He remains on apixaban 5 mg twice daily and does not meet reduced dosing criteria.  Recent labs showed stable hemoglobin, electrolytes, and renal function.  No symptoms concerning for bleeding.  PVCs: Quiescent.   Remains on metoprolol succinate 150 mg.  HTN: Blood pressure is mildly elevated in the office at 146/82, though he reports well-controlled blood pressures at home.  Remains on lisinopril 10 mg and Toprol-XL 150 mg.  Low-sodium diet encouraged.      Disposition: F/u with Dr. Okey Dupre or an APP in 12 months.   Medication Adjustments/Labs and Tests Ordered: Current medicines are reviewed at length with the patient today.  Concerns regarding medicines are outlined above. Medication changes, Labs and Tests ordered today are summarized above and listed in the Patient Instructions accessible in Encounters.   Signed, Eula Listen, PA-C 05/08/2023 11:06 AM     [facility] [place] [address] [place], [place] [address] [phone]

## 2023-05-08 ENCOUNTER — Encounter: Payer: Self-pay | Admitting: Physician Assistant

## 2023-05-08 ENCOUNTER — Ambulatory Visit: Payer: Medicare HMO | Attending: Physician Assistant | Admitting: Physician Assistant

## 2023-05-08 VITALS — BP 146/82 | HR 67 | Ht 69.0 in | Wt 266.0 lb

## 2023-05-08 DIAGNOSIS — I10 Essential (primary) hypertension: Secondary | ICD-10-CM

## 2023-05-08 DIAGNOSIS — I4892 Unspecified atrial flutter: Secondary | ICD-10-CM

## 2023-05-08 DIAGNOSIS — I493 Ventricular premature depolarization: Secondary | ICD-10-CM | POA: Diagnosis not present

## 2023-05-08 NOTE — Patient Instructions (Signed)
 Medication Instructions:  Your Physician recommend you continue on your current medication as directed.    *If you need a refill on your cardiac medications before your next appointment, please call your pharmacy*   Lab Work: None ordered  Follow-Up: At [facility], you and your health needs are our priority.  As part of our continuing mission to provide you with exceptional heart care, we have created designated Provider Care Teams.  These Care Teams include your primary Cardiologist (physician) and Advanced Practice Providers (APPs -  Physician Assistants and Nurse Practitioners) who all work together to provide you with the care you need, when you need it.  We recommend signing up for the patient portal called "MyChart".  Sign up information is provided on this After Visit Summary.  MyChart is used to connect with patients for Virtual Visits (Telemedicine).  Patients are able to view lab/test results, encounter notes, upcoming appointments, etc.  Non-urgent messages can be sent to your provider as well.   To learn more about what you can do with MyChart, go to [email or web address].    Your next appointment:   12 month(s)  Provider:   You may see Yvonne Kendall, MD or one of the following Advanced Practice Providers on your designated Care Team:   Eula Listen, [place]

## 2023-05-16 ENCOUNTER — Other Ambulatory Visit: Payer: Self-pay | Admitting: Internal Medicine

## 2023-07-24 DIAGNOSIS — I10 Essential (primary) hypertension: Secondary | ICD-10-CM | POA: Diagnosis not present

## 2023-07-24 DIAGNOSIS — R7303 Prediabetes: Secondary | ICD-10-CM | POA: Diagnosis not present

## 2023-07-24 DIAGNOSIS — E781 Pure hyperglyceridemia: Secondary | ICD-10-CM | POA: Diagnosis not present

## 2023-09-11 DIAGNOSIS — R7303 Prediabetes: Secondary | ICD-10-CM | POA: Diagnosis not present

## 2023-09-11 DIAGNOSIS — I4892 Unspecified atrial flutter: Secondary | ICD-10-CM | POA: Diagnosis not present

## 2023-09-11 DIAGNOSIS — I10 Essential (primary) hypertension: Secondary | ICD-10-CM | POA: Diagnosis not present

## 2023-09-11 DIAGNOSIS — Z0000 Encounter for general adult medical examination without abnormal findings: Secondary | ICD-10-CM | POA: Diagnosis not present

## 2023-09-11 DIAGNOSIS — E781 Pure hyperglyceridemia: Secondary | ICD-10-CM | POA: Diagnosis not present

## 2023-09-11 DIAGNOSIS — Z125 Encounter for screening for malignant neoplasm of prostate: Secondary | ICD-10-CM | POA: Diagnosis not present

## 2023-09-11 DIAGNOSIS — Z1331 Encounter for screening for depression: Secondary | ICD-10-CM | POA: Diagnosis not present

## 2023-09-11 DIAGNOSIS — D649 Anemia, unspecified: Secondary | ICD-10-CM | POA: Diagnosis not present

## 2023-09-20 ENCOUNTER — Encounter: Payer: Self-pay | Admitting: Internal Medicine

## 2023-09-21 MED ORDER — METOPROLOL SUCCINATE ER 100 MG PO TB24: ORAL_TABLET | ORAL | 3 refills | Status: AC

## 2023-11-16 ENCOUNTER — Encounter: Payer: Self-pay | Admitting: Internal Medicine

## 2023-11-16 ENCOUNTER — Other Ambulatory Visit (HOSPITAL_COMMUNITY): Payer: Self-pay

## 2023-11-16 ENCOUNTER — Telehealth: Payer: Self-pay

## 2023-11-16 ENCOUNTER — Telehealth: Payer: Self-pay | Admitting: Internal Medicine

## 2023-11-16 NOTE — Telephone Encounter (Signed)
 MAILING APPLICATION

## 2023-11-16 NOTE — Telephone Encounter (Signed)
 Based on information below, it sounds like cost of Xarelto and Eliquis  will be the same going forward once deductible has been met.  I would prefer that [name] remain on Eliquis ; it may be worthwhile for him to apply for medication assistance through the manufacturer.  If Eliquis  remains cost prohibitive, we could try transitioning to an alternative agent like Xarelto (if this is less expensive in the long-term) or warfarin.  Peter Crisp, MD [facility]

## 2023-11-16 NOTE — Telephone Encounter (Signed)
 Mailing patient an application with instructions. Approval will require patient to have spent 3% of income on medications this year to get approved.

## 2023-11-16 NOTE — Telephone Encounter (Signed)
 Will route to MD to see if switching would be a good idea.  From Eliquis  to Xarelto.   Thank you!

## 2023-11-16 NOTE — Telephone Encounter (Signed)
 PAP will require patient to meet deductible before they will qualify for any assistance. If patient cannot afford copay they can request a medicare payment plan with their insurance.

## 2023-11-16 NOTE — Telephone Encounter (Addendum)
 Pharmacy Patient Advocate Encounter  Insurance verification completed.   The patient is insured through [employer]   Ran test claim for XARELTO. Currently a quantity of 30 is a 30 day supply and the co-pay is $47 . The current 30 day co-pay is, $47.  No PA needed at this time.  This test claim was processed through [facility]- copay amounts may vary at other pharmacies due to pharmacy/plan contracts, or as the patient moves through the different stages of their insurance plan.   For Xarelto to be $47, thEliquis  copay has a deductible on it. Cost won't come down until deductible has been met so the $47 is assuming they pick up the current fill of Eliquis . PAP will require patient to meet deductible before they will qualify for any assistance. If patient cannot afford copay they can request a medicare payment plan with their insurance.

## 2023-11-16 NOTE — Telephone Encounter (Signed)
  Pt c/o medication issue:  1. Name of Medication: apixaban  (ELIQUIS ) 5 MG TABS tablet   2. How are you currently taking this medication (dosage and times per day)?   Take 1 tablet (5 mg total) by mouth 2 (two) times daily    3. Are you having a reaction (difficulty breathing--STAT)? No   4. What is your medication issue? Pt's wife called. She said that when they tried to fill the medication, it cost $271 for 30 days, and they can't afford it. They normally get it for $50 for 30 days.

## 2023-11-16 NOTE — Telephone Encounter (Signed)
 Pharmacy Patient Advocate Encounter  Insurance verification completed.   The patient is insured through [employer]   Ran test claim for XARELTO. Currently a quantity of 30 is a 30 day supply and the co-pay is $47 . The current 30 day co-pay is, $47.  No PA needed at this time.  This test claim was processed through [facility]- copay amounts may vary at other pharmacies due to pharmacy/plan contracts, or as the patient moves through the different stages of their insurance plan.   For Xarelto to be $47, thEliquis  copay has a deductible on it. Cost won't come down until deductible has been met so the $47 is assuming they pick up the current fill of Eliquis . PAP will require patient to meet deductible before they will qualify for any assistance. If patient cannot afford copay they can request a medicare payment plan with their insurance.

## 2023-11-16 NOTE — Telephone Encounter (Signed)
Attempted to contact pt. Unable to leave message as mailbox is full.  °

## 2023-11-17 ENCOUNTER — Other Ambulatory Visit (HOSPITAL_COMMUNITY): Payer: Self-pay

## 2023-11-17 NOTE — Telephone Encounter (Signed)
Attempted to contact pt. Unable to leave message as mailbox is full.  °

## 2023-11-17 NOTE — Telephone Encounter (Signed)
 PAP: Patient assistance application for Eliquis  through [employer] ([employer]) has been mailed to pt's home address on file. Provider portion of application will be faxed to provider's office once patient portion is received.

## 2023-11-18 ENCOUNTER — Other Ambulatory Visit (HOSPITAL_COMMUNITY): Payer: Self-pay

## 2023-11-18 ENCOUNTER — Telehealth: Payer: Self-pay | Admitting: Pharmacy Technician

## 2023-11-18 NOTE — Telephone Encounter (Signed)
 Called spoke with pt, advised pt I was calling to help him with transitioning from Eliquis  to Warfarin.  Advised pt he would need to come into the office for an INR check weekly for about 4-6 weeks or until we could get his INR therapeutic and stable.  And after that we would try to get him to where the appts were only every 4-6 weeks if his INR remained stable.  Pt wanted to know exactly how long these weekly checks would occur and I advised him I could not give him an exact length of time.  Pt states he needs to think about wether or not he wants to start Warfarin because of transportation issues.  Pt states he will call our office back once he makes a decision.  I advised pt I was happty to help him transition to Warfarin when/if he decided to do so.  I will forward this message back to Dr End and staff to make aware and I will await a call back.

## 2023-11-18 NOTE — Telephone Encounter (Signed)
 Me to Lorriane Rote, LPN  [name], RN    11/18/23 10:30 AM Hi, I called [employer] and the dabigatran 150mg  would be 108.30 for 30 days and xarelto would be the same price as eliquis  of 271.97 a month. I asked them how much his copay would be if he did the medicare payment plan for eliquis  they said it would be 31.37 every month for 8 months with the addition of 47.00 a month starting in June. He does have the option of continuing the payment plan and his cost would just in case by a few dollars each month until its paid off. I called the patient and he did not answer and his vm was full. Both numbers for his wife was not working. I sent him a mychart to call me back to see if he is ok with the payment plan and stay on eliquis . I will continue to try to get in touch with the patient. Thank you Lorriane Rote, LPN to Me  [name], RN    11/18/23  9:25 AM Hey, can you see if any of these would be a better option?  Thank you! Sammy Crisp, MD to Lorriane Rote, LPN  [name], RN    11/18/23  9:04 AM Is it possible to see if rivaroxaban or dabigitran are less expensive with his insurance?  If both of these are cost prohibitive, the only other options would be to consider warfarin or consultation with EP to discuss left atrial appendage occlusion.  Sammy Crisp, MD [facility]

## 2023-11-18 NOTE — Telephone Encounter (Signed)
 Please refer Mr. Odor to our anticoagulation team to assist with transitioning from apixaban  to warfarin.  Thanks.  Sammy Crisp, MD [facility]

## 2023-11-18 NOTE — Telephone Encounter (Signed)
 Patient wants to be changed to warfarin

## 2023-11-18 NOTE — Telephone Encounter (Signed)
 Lorriane Rote, LPN to Peter Crisp, MD    11/18/23 10:59 AM Will route to MD to make him aware.  Thank you! Me to Lorriane Rote, LPN  [name], RN     11/18/23 10:57 AM Hi, the patient's wife called me back and said the patient would like to be changed to warfarin (I told her that he would have to be checked routinely). The warfarin has a zero dollar copay per a test claim. She called me at [phone], even though that number does not work for me when I had tried it. She called me after sending a mychart. I told her you guys will probably be reaching out to her if it was ok for him to be changed to warfarin. Thank you!

## 2023-11-18 NOTE — Telephone Encounter (Signed)
 Patient wife called back and said she will talk to her husband but he will probably want to go the warfarin route. Per test claim it should be free every month for the warfarin. She is aware they would have to come in to have their INR checked routinely. She will call me back

## 2023-11-19 NOTE — Telephone Encounter (Signed)
 The patient called back and said they would like to stay on eliquis  and sign up for the medicare payment plan. I called [employer] and assisted them get set up with the plan. She said she will call me back if anything changes.

## 2023-11-19 NOTE — Telephone Encounter (Signed)
 Peter Hartman, CPhT to Me (Selected Message)     11/19/23 10:30 AM The patient called back and said they would like to stay on eliquis  and sign up for the medicare payment plan. I called [employer] and assisted them get set up with the plan. She said she will call me back if anything changes.

## 2023-11-19 NOTE — Telephone Encounter (Signed)
Please see updated encounter  

## 2023-11-20 ENCOUNTER — Encounter: Payer: Self-pay | Admitting: Pharmacy Technician

## 2023-11-20 NOTE — Telephone Encounter (Signed)
 Patient called back asking eliquis  to be sent to [id number] - [place] - [address]  [address], [place]  [id number]   I called [facility] and now its filled for free on the payment plan

## 2023-12-15 NOTE — Telephone Encounter (Signed)
 Completed patient application in media

## 2024-02-01 ENCOUNTER — Other Ambulatory Visit (HOSPITAL_COMMUNITY): Payer: Self-pay

## 2024-02-01 ENCOUNTER — Telehealth: Payer: Self-pay | Admitting: Pharmacy Technician

## 2024-02-01 NOTE — Telephone Encounter (Signed)
 Hi, scanned in media  from 12/15/23 is the [employer]ms patient application. Can someone please get the provider to sign and fax back to us  at [phone]? Thank you!     Patient wife said he has been out for a few days. I ran a test claim and it would be 47.00 for a month. He is still set up on the medicare payment plan but she wants us  to try with [employer]. She said he has been paying insurance 50.00 a month. She is going to get the out of pocket expense report, we have everything else except that and the provider form.

## 2024-02-03 NOTE — Telephone Encounter (Signed)
 Med assistance packet from [employer] shown to [employer] and advised to give to [place] - placed on his desk

## 2024-02-06 ENCOUNTER — Other Ambulatory Visit: Payer: Self-pay | Admitting: Internal Medicine

## 2024-02-08 NOTE — Telephone Encounter (Signed)
 Prescription refill request for Eliquis  received. Indication:aflutter Last office visit:11/24 Scr:0.9  1/25 Age: 73 Weight:120.7  kg  Prescription refilled

## 2024-02-11 NOTE — Telephone Encounter (Signed)
 PAP: Application for Eliquis has been submitted to [employer] ([employer]), via fax

## 2024-02-11 NOTE — Telephone Encounter (Signed)
 Received paperwork from provider yesterday and gave paperwork to Sabrina this morning.  She'll send it forward.

## 2024-02-11 NOTE — Telephone Encounter (Signed)
 Provider portion received and scanned to media.

## 2024-02-12 NOTE — Telephone Encounter (Signed)
 Scanned to media.

## 2024-02-12 NOTE — Telephone Encounter (Signed)
 Faxed bms 02/11/24

## 2024-02-17 NOTE — Telephone Encounter (Signed)
 Found this in media: [id number]. Sent bms corrected application with required missing information. 02/17/24 8:36am

## 2024-02-18 ENCOUNTER — Other Ambulatory Visit (HOSPITAL_COMMUNITY): Payer: Self-pay

## 2024-02-18 ENCOUNTER — Encounter: Payer: Self-pay | Admitting: Pharmacy Technician

## 2024-02-18 NOTE — Telephone Encounter (Signed)
  Denial in chart media

## 2024-03-03 ENCOUNTER — Telehealth: Payer: Self-pay | Admitting: Pharmacy Technician

## 2024-03-03 NOTE — Telephone Encounter (Signed)
 Patient Advocate Encounter   The patient was approved for a [employer] grant that will help cover the cost of eliquis  Total amount awarded, 7500.  Effective: 02/02/24 - 01/31/25   APW:[id number] ERW:[id number] Hmnle:[id number] PI:[id number] [employer] ID: [id number]   Pharmacy provided with approval and processing information. Patient informed via telephone   He said his copay was 47.00 on [employer] on 02/08/24. Now its free on [employer] and this. I called wife and let her know

## 2024-03-10 DIAGNOSIS — I10 Essential (primary) hypertension: Secondary | ICD-10-CM | POA: Diagnosis not present

## 2024-03-10 DIAGNOSIS — Z862 Personal history of diseases of the blood and blood-forming organs and certain disorders involving the immune mechanism: Secondary | ICD-10-CM | POA: Diagnosis not present

## 2024-03-10 DIAGNOSIS — Z125 Encounter for screening for malignant neoplasm of prostate: Secondary | ICD-10-CM | POA: Diagnosis not present

## 2024-03-10 DIAGNOSIS — R7303 Prediabetes: Secondary | ICD-10-CM | POA: Diagnosis not present

## 2024-03-10 DIAGNOSIS — E781 Pure hyperglyceridemia: Secondary | ICD-10-CM | POA: Diagnosis not present

## 2024-03-17 DIAGNOSIS — D649 Anemia, unspecified: Secondary | ICD-10-CM | POA: Diagnosis not present

## 2024-03-17 DIAGNOSIS — E781 Pure hyperglyceridemia: Secondary | ICD-10-CM | POA: Diagnosis not present

## 2024-03-17 DIAGNOSIS — R7303 Prediabetes: Secondary | ICD-10-CM | POA: Diagnosis not present

## 2024-03-17 DIAGNOSIS — Z6841 Body Mass Index (BMI) 40.0 and over, adult: Secondary | ICD-10-CM | POA: Diagnosis not present

## 2024-03-17 DIAGNOSIS — I10 Essential (primary) hypertension: Secondary | ICD-10-CM | POA: Diagnosis not present

## 2024-07-20 ENCOUNTER — Emergency Department

## 2024-07-20 ENCOUNTER — Other Ambulatory Visit: Payer: Self-pay

## 2024-07-20 ENCOUNTER — Observation Stay
Admission: EM | Admit: 2024-07-20 | Discharge: 2024-07-22 | Disposition: A | Discharge status: Home / Self Care | Attending: Internal Medicine | Admitting: Internal Medicine

## 2024-07-20 ENCOUNTER — Encounter: Payer: Self-pay | Admitting: Emergency Medicine

## 2024-07-20 DIAGNOSIS — Z79899 Other long term (current) drug therapy: Secondary | ICD-10-CM | POA: Diagnosis not present

## 2024-07-20 DIAGNOSIS — Z6841 Body Mass Index (BMI) 40.0 and over, adult: Secondary | ICD-10-CM | POA: Diagnosis not present

## 2024-07-20 DIAGNOSIS — E66813 Obesity, class 3: Secondary | ICD-10-CM | POA: Insufficient documentation

## 2024-07-20 DIAGNOSIS — I4892 Unspecified atrial flutter: Secondary | ICD-10-CM | POA: Diagnosis not present

## 2024-07-20 DIAGNOSIS — Z87891 Personal history of nicotine dependence: Secondary | ICD-10-CM | POA: Diagnosis not present

## 2024-07-20 DIAGNOSIS — K922 Gastrointestinal hemorrhage, unspecified: Principal | ICD-10-CM | POA: Diagnosis present

## 2024-07-20 DIAGNOSIS — G4733 Obstructive sleep apnea (adult) (pediatric): Secondary | ICD-10-CM | POA: Diagnosis not present

## 2024-07-20 DIAGNOSIS — R933 Abnormal findings on diagnostic imaging of other parts of digestive tract: Secondary | ICD-10-CM | POA: Diagnosis not present

## 2024-07-20 DIAGNOSIS — K921 Melena: Principal | ICD-10-CM | POA: Insufficient documentation

## 2024-07-20 DIAGNOSIS — Z7901 Long term (current) use of anticoagulants: Secondary | ICD-10-CM | POA: Diagnosis not present

## 2024-07-20 DIAGNOSIS — I10 Essential (primary) hypertension: Secondary | ICD-10-CM | POA: Insufficient documentation

## 2024-07-20 DIAGNOSIS — K625 Hemorrhage of anus and rectum: Secondary | ICD-10-CM | POA: Diagnosis present

## 2024-07-20 LAB — COMPREHENSIVE METABOLIC PANEL WITH GFR
ALT: 14 U/L (ref 0–44)
AST: 19 U/L (ref 15–41)
Albumin: 4.1 g/dL (ref 3.5–5.0)
Alkaline Phosphatase: 67 U/L (ref 38–126)
Anion gap: 11 (ref 5–15)
BUN: 15 mg/dL (ref 8–23)
CO2: 25 mmol/L (ref 22–32)
Calcium: 8.9 mg/dL (ref 8.9–10.3)
Chloride: 102 mmol/L (ref 98–111)
Creatinine, Ser: 0.83 mg/dL (ref 0.61–1.24)
GFR, Estimated: >60 mL/min
Glucose, Bld: 142 mg/dL — ABNORMAL HIGH (ref 70–99)
Potassium: 3.9 mmol/L (ref 3.5–5.1)
Sodium: 138 mmol/L (ref 135–145)
Total Bilirubin: 0.2 mg/dL (ref 0.0–1.2)
Total Protein: 6.9 g/dL (ref 6.5–8.1)

## 2024-07-20 LAB — TYPE AND SCREEN
ABO/RH(D): O POS
Antibody Screen: NEGATIVE

## 2024-07-20 LAB — CBC
HCT: 39.7 % (ref 39.0–52.0)
Hemoglobin: 12.3 g/dL — ABNORMAL LOW (ref 13.0–17.0)
MCH: 23.7 pg — ABNORMAL LOW (ref 26.0–34.0)
MCHC: 31 g/dL (ref 30.0–36.0)
MCV: 76.5 fL — ABNORMAL LOW (ref 80.0–100.0)
Platelets: 320 K/uL (ref 150–400)
RBC: 5.19 MIL/uL (ref 4.22–5.81)
RDW: 16.3 % — ABNORMAL HIGH (ref 11.5–15.5)
WBC: 9 K/uL (ref 4.0–10.5)
nRBC: 0 % (ref 0.0–0.2)

## 2024-07-20 LAB — PROTIME-INR
INR: 0.9 (ref 0.8–1.2)
Prothrombin Time: 13 s (ref 11.4–15.2)

## 2024-07-20 LAB — HEMOGLOBIN AND HEMATOCRIT, BLOOD
HCT: 37.2 % — ABNORMAL LOW (ref 39.0–52.0)
Hemoglobin: 11.6 g/dL — ABNORMAL LOW (ref 13.0–17.0)

## 2024-07-20 MED ORDER — PROTHROMBIN COMPLEX CONC HUMAN 500 UNITS IV KIT
5000.0000 [IU] | PACK | Status: AC
  Administered 2024-07-21: 5000 [IU] via INTRAVENOUS
  Filled 2024-07-20 (×2): qty 5000

## 2024-07-20 MED ORDER — IOHEXOL 350 MG/ML SOLN
100.0000 mL | Freq: Once | INTRAVENOUS | Status: AC | PRN
  Administered 2024-07-20: 100 mL via INTRAVENOUS

## 2024-07-20 NOTE — ED Provider Notes (Signed)
 "  [facility] Provider Note    Event Date/Time   First MD Initiated Contact with Patient 07/20/24 2051     (approximate)   History   Blood In Stools   HPI  Peter Hartman is a 74 y.o. male presents to the emergency department today because concerns for blood in his stools.  Patient states that he first noticed it today.  He says initially was on the darker side but then became brighter red.  The patient denies any associated abdominal pain.  Denies history of GI bleeds in the past.  Denies any recent fevers or chills. Patient is on eliquis .      Physical Exam   Triage Vital Signs: ED Triage Vitals  Encounter Vitals Group     BP 07/20/24 1823 (!) 184/101     Girls Systolic BP Percentile --      Girls Diastolic BP Percentile --      Boys Systolic BP Percentile --      Boys Diastolic BP Percentile --      Pulse Rate 07/20/24 1823 (!) 109     Resp 07/20/24 1823 20     Temp 07/20/24 1823 97.9 F (36.6 C)     Temp Source 07/20/24 1823 Oral     SpO2 07/20/24 1823 97 %     Weight 07/20/24 1824 264 lb 8.8 oz (120 kg)     Height 07/20/24 1824 5' 9 (1.753 m)     Head Circumference --      Peak Flow --      Pain Score 07/20/24 1823 0     Pain Loc --      Pain Education --      Exclude from Growth Chart --     Most recent vital signs: Vitals:   07/20/24 1823  BP: (!) 184/101  Pulse: (!) 109  Resp: 20  Temp: 97.9 F (36.6 C)  SpO2: 97%   General: Awake, alert, oriented. CV:  Good peripheral perfusion. Regular rate and rhythm. Resp:  Normal effort. Lungs clear. Abd:  No distention. Non tender.   ED Results / Procedures / Treatments   Labs (all labs ordered are listed, but only abnormal results are displayed) Labs Reviewed  COMPREHENSIVE METABOLIC PANEL WITH GFR - Abnormal; Notable for the following components:      Result Value   Glucose, Bld 142 (*)    All other components within normal limits  CBC - Abnormal; Notable for the following  components:   Hemoglobin 12.3 (*)    MCV 76.5 (*)    MCH 23.7 (*)    RDW 16.3 (*)    All other components within normal limits  PROTIME-INR  POC OCCULT BLOOD, ED  TYPE AND SCREEN     EKG  None   RADIOLOGY I independently interpreted and visualized the CTA Gi bleed. My interpretation: Acute bleed Radiology interpretation:  IMPRESSION:  1. Small amount of acute hemorrhage in the hepatic flexure of the colon,  without associated inflammation or obvious mass.  2. 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  3. Moderate sized right inguinal hernia containing mesenteric fat and a portion  of the bladder.  4. Cholelithiasis and diffuse colonic diverticulosis.      PROCEDURES:  Critical Care performed: No   MEDICATIONS ORDERED IN ED: Medications - No data to display  IMPRESSION / MDM / ASSESSMENT AND PLAN / ED COURSE  I reviewed the triage vital signs and the nursing notes.                              Differential diagnosis includes, but is not limited to, hemorrhoids, AVM, diverticulosis, neoplasm  Patient's presentation is most consistent with acute presentation with potential threat to life or bodily function.  Patient presented to the emergency department today because of concerns for GI bleed.  On exam patient is neither tachycardic nor hypotensive.  Abdomen is benign.  Blood work with very slight anemia.  Patient is on Eliquis .  Will get CT angio for GI bleed.  CT does show slight bleed at the hepatic flexure.  Discussed with oncoming provider who will provide further care.     FINAL CLINICAL IMPRESSION(S) / ED DIAGNOSES   Final diagnoses:  Gastrointestinal hemorrhage, unspecified gastrointestinal hemorrhage type     Note:  This document was prepared using Dragon voice recognition software and may include unintentional  dictation errors.    Floy Roberts, MD 07/20/24 [phone]  "

## 2024-07-20 NOTE — ED Provider Notes (Signed)
 Signout received.  CT angiogram with a small amount of bleeding.  Patient notes that he had had 1 larger episode of bleeding earlier in the day and had another bowel movement here with a small amount of blood, much less than before albeit still bleeding.  He is on Eliquis .  I have put in for reversal.  Repeat H&H down another half a point, which is a total of 1 hemoglobin point down from his baseline obtained in December 2025.  Hemodynamics still very reassuring with hypertension, normal pulse rate.  Reversal ordered.  Hospital admission.  Given his stability in the very small amount of bleed and the plan for reversal, I do not think he needs an emergent IR intervention currently but should he worsen it would be better to be admitted to have these interventions available.  He and his wife are agreeable to plan.   Cyrena Mylar, MD 07/20/24 [phone]

## 2024-07-20 NOTE — ED Triage Notes (Signed)
 Pt reports dark blood in stool today. Denies pain. Pt on eliquis . Pt drinks daily, couple of beers today.

## 2024-07-21 ENCOUNTER — Observation Stay: Admitting: Anesthesiology

## 2024-07-21 ENCOUNTER — Encounter: Payer: Self-pay | Admitting: Gastroenterology

## 2024-07-21 ENCOUNTER — Encounter
Admission: EM | Disposition: A | Discharge status: Home / Self Care | Payer: Self-pay | Source: Home / Self Care | Attending: Emergency Medicine

## 2024-07-21 DIAGNOSIS — K922 Gastrointestinal hemorrhage, unspecified: Secondary | ICD-10-CM | POA: Diagnosis not present

## 2024-07-21 DIAGNOSIS — K625 Hemorrhage of anus and rectum: Secondary | ICD-10-CM

## 2024-07-21 DIAGNOSIS — E66813 Obesity, class 3: Secondary | ICD-10-CM | POA: Insufficient documentation

## 2024-07-21 DIAGNOSIS — K5730 Diverticulosis of large intestine without perforation or abscess without bleeding: Secondary | ICD-10-CM | POA: Diagnosis not present

## 2024-07-21 DIAGNOSIS — G4733 Obstructive sleep apnea (adult) (pediatric): Secondary | ICD-10-CM | POA: Insufficient documentation

## 2024-07-21 DIAGNOSIS — K635 Polyp of colon: Secondary | ICD-10-CM

## 2024-07-21 DIAGNOSIS — Z7901 Long term (current) use of anticoagulants: Secondary | ICD-10-CM

## 2024-07-21 HISTORY — PX: COLONOSCOPY: SHX5424

## 2024-07-21 LAB — CBC
HCT: 37.3 % — ABNORMAL LOW (ref 39.0–52.0)
HCT: 38.9 % — ABNORMAL LOW (ref 39.0–52.0)
Hemoglobin: 11.6 g/dL — ABNORMAL LOW (ref 13.0–17.0)
Hemoglobin: 12.1 g/dL — ABNORMAL LOW (ref 13.0–17.0)
MCH: 23.9 pg — ABNORMAL LOW (ref 26.0–34.0)
MCH: 23.9 pg — ABNORMAL LOW (ref 26.0–34.0)
MCHC: 31.1 g/dL (ref 30.0–36.0)
MCHC: 31.1 g/dL (ref 30.0–36.0)
MCV: 76.7 fL — ABNORMAL LOW (ref 80.0–100.0)
MCV: 76.7 fL — ABNORMAL LOW (ref 80.0–100.0)
Platelets: 283 K/uL (ref 150–400)
Platelets: 319 K/uL (ref 150–400)
RBC: 4.86 MIL/uL (ref 4.22–5.81)
RBC: 5.07 MIL/uL (ref 4.22–5.81)
RDW: 16.5 % — ABNORMAL HIGH (ref 11.5–15.5)
RDW: 16.6 % — ABNORMAL HIGH (ref 11.5–15.5)
WBC: 8.3 K/uL (ref 4.0–10.5)
WBC: 9.1 K/uL (ref 4.0–10.5)
nRBC: 0 % (ref 0.0–0.2)
nRBC: 0 % (ref 0.0–0.2)

## 2024-07-21 MED ORDER — NA SULFATE-K SULFATE-MG SULF 17.5-3.13-1.6 GM/177ML PO SOLN: 1.0000 | Freq: Once | ORAL | Status: DC

## 2024-07-21 MED ORDER — LIDOCAINE HCL (CARDIAC) PF 100 MG/5ML IV SOSY
PREFILLED_SYRINGE | INTRAVENOUS | Status: DC | PRN
  Administered 2024-07-21: 40 mg via INTRAVENOUS

## 2024-07-21 MED ORDER — PROPOFOL 10 MG/ML IV BOLUS
INTRAVENOUS | Status: DC | PRN
  Administered 2024-07-21: 100 mg via INTRAVENOUS

## 2024-07-21 MED ORDER — PHENYLEPHRINE 80 MCG/ML (10ML) SYRINGE FOR IV PUSH (FOR BLOOD PRESSURE SUPPORT)
PREFILLED_SYRINGE | INTRAVENOUS | Status: AC
  Filled 2024-07-21: qty 10

## 2024-07-21 MED ORDER — NA SULFATE-K SULFATE-MG SULF 17.5-3.13-1.6 GM/177ML PO SOLN
0.5000 | Freq: Once | ORAL | Status: AC
  Administered 2024-07-21: 177 mL via ORAL
  Filled 2024-07-21: qty 1

## 2024-07-21 MED ORDER — PROPOFOL 10 MG/ML IV BOLUS
INTRAVENOUS | Status: AC
  Filled 2024-07-21: qty 40

## 2024-07-21 MED ORDER — PROPOFOL 500 MG/50ML IV EMUL
INTRAVENOUS | Status: DC | PRN
  Administered 2024-07-21: 120 ug/kg/min via INTRAVENOUS

## 2024-07-21 MED ORDER — SODIUM CHLORIDE 0.9 % IV SOLN: INTRAVENOUS | Status: AC

## 2024-07-21 MED ORDER — NA SULFATE-K SULFATE-MG SULF 17.5-3.13-1.6 GM/177ML PO SOLN
0.5000 | Freq: Once | ORAL | Status: AC
  Administered 2024-07-21: 177 mL via ORAL

## 2024-07-21 MED ORDER — SODIUM CHLORIDE 0.9 % IV SOLN: INTRAVENOUS | Status: DC

## 2024-07-21 MED ORDER — PHENYLEPHRINE 80 MCG/ML (10ML) SYRINGE FOR IV PUSH (FOR BLOOD PRESSURE SUPPORT)
PREFILLED_SYRINGE | INTRAVENOUS | Status: DC | PRN
  Administered 2024-07-21: 80 ug via INTRAVENOUS

## 2024-07-21 NOTE — ED Notes (Addendum)
 Pt ambulated to and from bathroom with independent and steady gait. Patient now resting back in bed free from sign of distress. Breathing unlabored speaking in full sentences with symmetric chest rise and fall. Bed low and locked with side rails raised x2. Call bell in reach and monitor in place.

## 2024-07-21 NOTE — Assessment & Plan Note (Addendum)
 Holding  metoprolol  in the setting of acute GI bleed Holding Eliquis 

## 2024-07-21 NOTE — Assessment & Plan Note (Signed)
"   Not on CPAP         "

## 2024-07-21 NOTE — Anesthesia Preprocedure Evaluation (Addendum)
"                                    Anesthesia Evaluation  Patient identified by MRN, date of birth, ID band Patient awake    Reviewed: Allergy & Precautions, H&P , NPO status , Patient's Chart, lab work & pertinent test results  Airway Mallampati: II  TM Distance: >3 FB Neck ROM: full    Dental no notable dental hx.    Pulmonary sleep apnea , former smoker   Pulmonary exam normal        Cardiovascular hypertension, + dysrhythmias Atrial Fibrillation      Neuro/Psych negative neurological ROS  negative psych ROS   GI/Hepatic negative GI ROS, Neg liver ROS,,,  Endo/Other    Class 3 obesity  Renal/GU negative Renal ROS  negative genitourinary   Musculoskeletal   Abdominal  (+) + obese  Peds  Hematology  (+) Blood dyscrasia, anemia   Anesthesia Other Findings Past Medical History: No date: ETOH abuse No date: History of echocardiogram     Comment:  a. 05/2020 Echo: EF 55-60%, nl RV fxn. Triv MR. No date: HLD (hyperlipidemia) No date: Hypertension No date: OSA (obstructive sleep apnea) No date: Paroxysmal atrial flutter (HCC)     Comment:  a. Dx 05/2020-->converted w/ oral metoprolol .               CHA2DS2VASc = 2-->eliquis . Pt with rectal bleeding. He had a episode of vasovagal symptoms today. [facility] was given this morning.   Past Surgical History: No date: HERNIA REPAIR  BMI    Body Mass Index: 41.60 kg/m      Reproductive/Obstetrics negative OB ROS                              Anesthesia Physical Anesthesia Plan  ASA: 3  Anesthesia Plan: General   Post-op Pain Management:    Induction: Intravenous  PONV Risk Score and Plan: Propofol  infusion and TIVA  Airway Management Planned:   Additional Equipment:   Intra-op Plan:   Post-operative Plan:   Informed Consent:      Dental Advisory Given  Plan Discussed with: CRNA and Surgeon  Anesthesia Plan Comments:          Anesthesia Quick  Evaluation  "

## 2024-07-21 NOTE — Anesthesia Postprocedure Evaluation (Signed)
"   Anesthesia Post Note  Patient: Peter Hartman  Procedure(s) Performed: COLONOSCOPY  Patient location during evaluation: Endoscopy Anesthesia Type: General Level of consciousness: awake and alert Pain management: pain level controlled Vital Signs Assessment: post-procedure vital signs reviewed and stable Respiratory status: spontaneous breathing, nonlabored ventilation and respiratory function stable Cardiovascular status: blood pressure returned to baseline and stable Postop Assessment: no apparent nausea or vomiting Anesthetic complications: no   No notable events documented.   Last Vitals:  Vitals:   07/21/24 1606 07/21/24 1733  BP: (!) 132/52 (!) 152/88  Pulse: 71 88  Resp: 16 16  Temp:  36.5 C  SpO2: 100% 100%    Last Pain:  Vitals:   07/21/24 1606  TempSrc:   PainSc: 0-No pain                 Camellia Merilee Louder      "

## 2024-07-21 NOTE — H&P (Signed)
 " History and Physical    Patient: Peter Hartman FMW:[id number] DOB: [birth year] DOA: 07/20/2024 DOS: the patient was seen and examined on 07/21/2024 PCP: Alla Amis, MD  Patient coming from: Home  Chief Complaint:  Chief Complaint  Patient presents with   Blood In Stools    HPI: Peter Hartman is a 74 y.o. male with medical history significant for Atrial flutter on Eliquis , OSA, , class III obesity, being admitted with rectal bleeding.  No prior colonoscopy on record-had previously declined.  He denied chest pain, shortness of breath, lightheadedness.  Did have a vasovagal syncopal episode in the past month(per last PCP note from 07/05/2024).  He currently denies abdominal pain.  He was in his usual state of health and had a large bowel movement with blood at home and then had another smaller one here in the ED.  Patient is hemodynamically stable.  CT angio showed a small amount of acute hemorrhage in the hepatic flexure of the colon and incidental finding of celiac and mesenteric artery stenosis. He was observed for several hours in the ED nd had a slight downtrend in his hemoglobin from 12.3-->11.6 (was 12.7, 06/29/2024) Eliquis  reversal was ordered. Admission requested     Past Medical History:  Diagnosis Date   ETOH abuse    History of echocardiogram    a. 05/2020 Echo: EF 55-60%, nl RV fxn. Triv MR.   HLD (hyperlipidemia)    Hypertension    OSA (obstructive sleep apnea)    Paroxysmal atrial flutter (HCC)    a. Dx 05/2020-->converted w/ oral metoprolol . CHA2DS2VASc = 2-->eliquis .   Past Surgical History:  Procedure Laterality Date   HERNIA REPAIR     Social History:  reports that he quit smoking about 8 years ago. His smoking use included cigarettes. He has never used smokeless tobacco. He reports current alcohol use. He reports that he does not use drugs.  Allergies[1]  Family History  Problem Relation Age of Onset   Prostate cancer Neg Hx    Bladder Cancer Neg  Hx    Kidney cancer Neg Hx     Prior to Admission medications  Medication Sig Start Date End Date Taking? Authorizing Provider  AMBULATORY NON FORMULARY MEDICATION Trimix (30/1/10)-(Pap/Phent/PGE)  Test Dose  1ml vial   Qty #3 Refills 0  [facility] [phone] Fax [phone] Patient not taking: Reported on 05/08/2023 01/30/23   Twylla Glendia BROCKS, MD  apixaban  (ELIQUIS ) 5 MG TABS tablet Take 1 tablet by mouth twice daily 02/08/24   End, Lonni, MD  benzonatate (TESSALON) 200 MG capsule Take 200 mg by mouth 3 (three) times daily as needed for cough. Patient not taking: Reported on 05/08/2023    [provider]  fenofibrate  micronized (LOFIBRA) 200 MG capsule Take 200 mg by mouth daily. 05/29/18   [provider]  lisinopril  (ZESTRIL ) 10 MG tablet Take 10 mg by mouth daily. 02/16/20   [provider]  metoprolol  succinate (TOPROL -XL) 100 MG 24 hr tablet TAKE 1 & 1/2 (ONE & ONE-HALF) TABLETS BY MOUTH ONCE DAILY WITH OR IMMEDIATELY FOLLOWING A MEAL 09/21/23   Dunn, Bernardino HERO, PA-C  sildenafil  (VIAGRA ) 50 MG tablet Take 50 mg by mouth at bedtime. 01/20/20   [provider]  traZODone (DESYREL) 50 MG tablet Take 100 mg by mouth at bedtime as needed. 11/14/22   [provider]    Physical Exam: Vitals:   07/20/24 2328 07/20/24 2330 07/20/24 2340 07/21/24 0000  BP:  (!) 162/72  ROLLEN)  140/76  Pulse:  71  85  Resp:  16  18  Temp: 98.3 F (36.8 C)     TempSrc: Oral     SpO2:  100%  100%  Weight:   127.8 kg   Height:       Physical Exam Vitals and nursing note reviewed.  Constitutional:      General: He is not in acute distress. HENT:     Head: Normocephalic and atraumatic.  Cardiovascular:     Rate and Rhythm: Normal rate and regular rhythm.     Heart sounds: Normal heart sounds.  Pulmonary:     Effort: Pulmonary effort is normal.     Breath sounds: Normal breath sounds.  Abdominal:     Palpations: Abdomen is soft.      Tenderness: There is no abdominal tenderness.  Neurological:     Mental Status: Mental status is at baseline.     Labs on Admission: I have personally reviewed following labs and imaging studies  CBC: Recent Labs  Lab 07/20/24 1825 07/20/24 2329  WBC 9.0  --   HGB 12.3* 11.6*  HCT 39.7 37.2*  MCV 76.5*  --   PLT 320  --    Basic Metabolic Panel: Recent Labs  Lab 07/20/24 1825  NA 138  K 3.9  CL 102  CO2 25  GLUCOSE 142*  BUN 15  CREATININE 0.83  CALCIUM 8.9   GFR: Estimated Creatinine Clearance: 108 mL/min (by C-G formula based on SCr of 0.83 mg/dL). Liver Function Tests: Recent Labs  Lab 07/20/24 1825  AST 19  ALT 14  ALKPHOS 67  BILITOT 0.2  PROT 6.9  ALBUMIN 4.1   No results for input(s): LIPASE, AMYLASE in the last 168 hours. No results for input(s): AMMONIA in the last 168 hours. Coagulation Profile: Recent Labs  Lab 07/20/24 1825  INR 0.9   Cardiac Enzymes: No results for input(s): CKTOTAL, CKMB, CKMBINDEX, TROPONINI in the last 168 hours. BNP (last 3 results) No results for input(s): PROBNP in the last 8760 hours. HbA1C: No results for input(s): HGBA1C in the last 72 hours. CBG: No results for input(s): GLUCAP in the last 168 hours. Lipid Profile: No results for input(s): CHOL, HDL, LDLCALC, TRIG, CHOLHDL, LDLDIRECT in the last 72 hours. Thyroid  Function Tests: No results for input(s): TSH, T4TOTAL, FREET4, T3FREE, THYROIDAB in the last 72 hours. Anemia Panel: No results for input(s): VITAMINB12, FOLATE, FERRITIN, TIBC, IRON, RETICCTPCT in the last 72 hours. Urine analysis: No results found for: COLORURINE, APPEARANCEUR, LABSPEC, PHURINE, GLUCOSEU, HGBUR, BILIRUBINUR, KETONESUR, PROTEINUR, UROBILINOGEN, NITRITE, LEUKOCYTESUR  Radiological Exams on Admission: CT ANGIO GI BLEED Result Date: 07/20/2024 EXAM: CTA ABDOMEN AND PELVIS WITH CONTRAST 07/20/2024 11:01:03  PM TECHNIQUE: CTA images of the abdomen and pelvis with intravenous contrast. Three-dimensional MIP/volume rendered formations were performed. Automated exposure control, iterative reconstruction, and/or weight based adjustment of the mA/kV was utilized to reduce the radiation dose to as low as reasonably achievable. COMPARISON: None available. CLINICAL HISTORY: gi bleed FINDINGS: VASCULATURE: GI BLEED: There is a small amount of acute hemorrhage within the hepatic flexure of the colon. There is no inflammation or obvious mass at this level. AORTA: Moderate calcified atherosclerotic disease throughout the aorta. No abdominal aortic aneurysm. No dissection. 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 SUPERIOR MESENTERIC ARTERY: Mild stenosis at the origin of the superior mesenteric artery secondary to calcified atherosclerotic disease. INFERIOR MESENTERIC ARTERY: No acute finding. No occlusion  or significant stenosis. RENAL ARTERIES: No acute finding. No occlusion or significant stenosis. ILIAC ARTERIES: Moderate calcified atherosclerotic disease throughout the bilateral iliac arteries. ABDOMEN/PELVIS: LOWER CHEST: Visualized portion of the lower chest demonstrates no acute abnormality. LIVER: The liver is unremarkable. GALLBLADDER AND BILE DUCTS: Small gallstones are present. No biliary ductal dilatation. SPLEEN: The spleen is unremarkable. PANCREAS: The pancreas is unremarkable. ADRENAL GLANDS: Bilateral adrenal glands demonstrate no acute abnormality. KIDNEYS, URETERS AND BLADDER: A portion of the bladder is contained within a right inguinal hernia. No stones in the kidneys or ureters. No hydronephrosis. No perinephric or periureteral stranding. The urinary bladder is otherwise unremarkable. GI AND BOWEL: There is a small amount of acute hemorrhage within the hepatic flexure of the colon. There is no inflammation or obvious mass at this level.  There is diffuse colonic diverticulosis. The appendix appears normal. Stomach and duodenal sweep demonstrate no acute abnormality. There is no bowel obstruction. No abnormal bowel wall thickening or distension. REPRODUCTIVE: Reproductive organs are unremarkable. PERITONEUM AND RETROPERITONEUM: There is a moderate sized right inguinal hernia containing mesenteric fat and a portion of the bladder. There is a small fat containing umbilical hernia. No ascites or free air. LYMPH NODES: No lymphadenopathy. BONES AND SOFT TISSUES: Degenerative changes affect the spine and hips. No acute soft tissue abnormality. IMPRESSION: 1. Small amount of acute hemorrhage in the hepatic flexure of the colon, without associated inflammation or obvious mass. 2. 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 3. Moderate sized right inguinal hernia containing mesenteric fat and a portion of the bladder. 4. Cholelithiasis and diffuse colonic diverticulosis. Electronically signed by: Greig Pique MD 07/20/2024 11:11 PM EST RP Workstation: [id number]   Data Reviewed for HPI: Relevant notes from primary care and specialist visits, past discharge summaries as available in EHR, including Care Everywhere. Prior diagnostic testing as pertinent to current admission diagnoses Updated medications and problem lists for reconciliation ED course, including vitals, labs, imaging, treatment and response to treatment Triage notes, nursing and pharmacy notes and ED provider's notes Notable results as noted above in HPI      Assessment and Plan: * Lower GI bleed Chronic anticoagulation Patient hemodynamically stable CT angio with small acute hemorrhage hepatic flexure Small downtrend in hemoglobin 12.3-11.6 (was 12.7 06/29/2024) [facility]  reversal ordered from the ED Hold Eliquis  Continue serial H&H--if ongoing  bleeding, will need IR consult for consideration of embolization Will keep n.p.o. in case of procedure GI consult  Paroxysmal atrial flutter (HCC) Holding  metoprolol  in the setting of acute GI bleed Holding Eliquis   Essential hypertension Holding antihypertensives in the setting of acute GI bleed  OSA (obstructive sleep apnea) Not on CPAP  Obesity, Class III, BMI 40-49.9 (morbid obesity) (HCC) Complicating factor to overall prognosis and care    DVT prophylaxis: SCD  Consults: Gi  Advance Care Planning:   Code Status: Prior   Family Communication: none  Disposition Plan: Back to previous home environment  Severity of Illness: The appropriate patient status for this patient is OBSERVATION. Observation status is judged to be reasonable and necessary in order to provide the required intensity of service to ensure the patient's safety. The patient's presenting symptoms, physical exam findings, and initial radiographic and laboratory data in the context of their medical condition is felt to place them at decreased risk for further clinical deterioration. Furthermore, it is anticipated that the patient will be medically  stable for discharge from the hospital within 2 midnights of admission.   Author: Delayne LULLA Solian, MD 07/21/2024 12:19 AM  For on call review [email or web address].      [1] No Known Allergies  "

## 2024-07-21 NOTE — Progress Notes (Signed)
" °  Progress Note   Patient: Peter Hartman FMW:[id number] DOB: July 15, 1950 DOA: 07/20/2024     0 DOS: the patient was seen and examined on 07/21/2024   Brief hospital course: Peter Hartman is a 74 y.o. male with medical history significant for Atrial flutter on Eliquis , OSA, , class III obesity, being admitted with rectal bleeding.  CT angiogram showed a small amount of bleeding in the hepatic flexure of the colon.  Seen by GI, colonoscopy scheduled today.   Principal Problem:   Lower GI bleed Active Problems:   Chronic anticoagulation   Paroxysmal atrial flutter (HCC)   Essential hypertension   Obesity, Class III, BMI 40-49.9 (morbid obesity) (HCC)   OSA (obstructive sleep apnea)   Assessment and Plan: * Lower GI bleed Chronic anticoagulation Hemoglobin stays stable, patient had a bowel prep last night, last bowel movement was a watery, still pink in color. Colonoscopy is pending.  Paroxysmal atrial flutter (HCC) Continue Eliquis , beta-blockers also on hold for concern of blood pressure.  Essential hypertension Holding antihypertensives in the setting of acute GI bleed  OSA (obstructive sleep apnea) Not on CPAP  Obesity, Class III, BMI 40-49.9 (morbid obesity) (HCC) Complicating factor to overall prognosis and care       Subjective:  Patient still has some pink stools today.  No abdominal pain or nausea vomiting.  Physical Exam: Vitals:   07/21/24 1000 07/21/24 1030 07/21/24 1100 07/21/24 1309  BP: (!) 139/101 (!) 155/91 (!) 151/88 136/79  Pulse: 98 98 100 91  Resp:   18 14  Temp:    97.6 F (36.4 C)  TempSrc:    Oral  SpO2: 97% 100% 99% 99%  Weight:      Height:       General exam: Appears calm and comfortable, morbidly obese. Respiratory system: Clear to auscultation. Respiratory effort normal. Cardiovascular system: S1 & S2 heard, RRR. No JVD, murmurs, rubs, gallops or clicks. No pedal edema. Gastrointestinal system: Abdomen is nondistended, soft and  nontender. No organomegaly or masses felt. Normal bowel sounds heard. Central nervous system: Alert and oriented. No focal neurological deficits. Extremities: Symmetric 5 x 5 power. Skin: No rashes, lesions or ulcers Psychiatry: Judgement and insight appear normal. Mood & affect appropriate.    Data Reviewed:  Reviewed CT scan and lab results.  Family Communication: Wife updated at bedside.  Disposition: Status is: Observation      Time spent: no charge  minutes  Author: Murvin Mana, MD 07/21/2024 1:28 PM  For on call review [email or web address].    "

## 2024-07-21 NOTE — Transfer of Care (Signed)
 Immediate Anesthesia Transfer of Care Note  Patient: Peter Hartman  Procedure(s) Performed: COLONOSCOPY  Patient Location: Endoscopy Unit  Anesthesia Type:General  Level of Consciousness: drowsy  Airway & Oxygen Therapy: Patient Spontanous Breathing  Post-op Assessment: Report given to RN and Post -op Vital signs reviewed and stable  Post vital signs: Reviewed and stable  Last Vitals:  Vitals Value Taken Time  BP 118/48 07/21/24 14:31  Temp 36.1 C 07/21/24 14:31  Pulse 91 07/21/24 14:33  Resp 21 07/21/24 14:33  SpO2 99 % 07/21/24 14:33  Vitals shown include unfiled device data.  Last Pain:  Vitals:   07/21/24 1431  TempSrc: Temporal  PainSc: Asleep         Complications: No notable events documented.

## 2024-07-21 NOTE — Hospital Course (Addendum)
 Peter Hartman is a 74 y.o. male with medical history significant for Atrial flutter on Eliquis , OSA, , class III obesity, being admitted with rectal bleeding.  CT angiogram showed a small amount of bleeding in the hepatic flexure of the colon.  Seen by GI, colonoscopy showed multiple polyps and diffuse diverticulosis.  No additional bleeding. Patient was monitored overnight after colonoscopy, hemoglobin still stable.  No additional bleeding.  Medically stable for discharge.  Will hold off anticoagulation until seen by GI again.

## 2024-07-21 NOTE — Assessment & Plan Note (Signed)
 Holding antihypertensives in the setting of acute GI bleed

## 2024-07-21 NOTE — Op Note (Signed)
 [facility] Gastroenterology Patient Name: Peter Hartman Procedure Date: 07/21/2024 10:42 AM MRN: [id number] Account #: [id number] Date of Birth: [birth year] Admit Type: Outpatient Age: 74 Room: [facility] ENDO ROOM 3 Gender: Male Note Status: Finalized Instrument Name: Colon Scope [id number] Procedure:             Colonoscopy Indications:           Hematochezia, Abnormal CT of the GI tract with                         bleeding at hepatic flexture. Providers:             Rogelia Copping MD, MD Referring MD:          Alda Carpen (Referring MD) Medicines:             Propofol  per Anesthesia Complications:         No immediate complications. Procedure:             Pre-Anesthesia Assessment:                        - Prior to the procedure, a History and Physical was                         performed, and patient medications and allergies were                         reviewed. The patient's tolerance of previous                         anesthesia was also reviewed. The risks and benefits                         of the procedure and the sedation options and risks                         were discussed with the patient. All questions were                         answered, and informed consent was obtained. Prior                         Anticoagulants: The patient has taken Eliquis                          (apixaban ), last dose was 2 days prior to procedure.                         ASA Grade Assessment: II - A patient with mild                         systemic disease. After reviewing the risks and                         benefits, the patient was deemed in satisfactory                         condition to undergo the procedure.  After obtaining informed consent, the colonoscope was                         passed under direct vision. Throughout the procedure,                         the patient's blood pressure, pulse, and oxygen                          saturations were monitored continuously. The                         Colonoscope was introduced through the anus and                         advanced to the the cecum, identified by appendiceal                         orifice and ileocecal valve. The colonoscopy was                         performed without difficulty. The patient tolerated                         the procedure well. The quality of the bowel                         preparation was good. Findings:      The perianal and digital rectal examinations were normal.      Multiple small-mouthed diverticula were found in the entire colon.      A 5 mm polyp was found in the hepatic flexure. The polyp was sessile.       Polypectomy was not attempted.      A 4 mm polyp was found in the transverse colon. The polyp was sessile.       Polypectomy was not attempted.      A 4 mm polyp was found in the cecum. The polyp was sessile. Polypectomy       was not attempted.      Three sessile polyps were found in the descending colon. The polyps were       3 to 5 mm in size. Polypectomy was not attempted. Impression:            - Diverticulosis in the entire examined colon.                        - One 5 mm polyp at the hepatic flexure. Resection not                         attempted.                        - One 4 mm polyp in the transverse colon. Resection                         not attempted.                        - One 4 mm polyp in the cecum. Resection not attempted.                        -  Three 3 to 5 mm polyps in the descending colon.                         Resection not attempted.                        - No specimens collected. Recommendation:        - Return patient to hospital ward for ongoing care.                        - Resume previous diet.                        - Continue present medications.                        - Repeat colonoscopy at appointment to be scheduled to                         remove the polyps.                         - If any further bleeding then consider IR due to it                         being a diverticular bleed, Procedure Code(s):     --- Professional ---                        [phone], Colonoscopy, flexible; diagnostic, including                         collection of specimen(s) by brushing or washing, when                         performed (separate procedure) Diagnosis Code(s):     --- Professional ---                        R93.3, Abnormal findings on diagnostic imaging of                         other parts of digestive tract                        K92.1, Melena (includes Hematochezia) CPT copyright 2022 American Medical Association. All rights reserved. The codes documented in this report are preliminary and upon coder review may  be revised to meet current compliance requirements. Rogelia Copping MD, MD 07/21/2024 2:36:03 PM This report has been signed electronically. Number of Addenda: 0 Note Initiated On: 07/21/2024 10:42 AM Scope Withdrawal Time: 0 hours 6 minutes 42 seconds  Total Procedure Duration: 0 hours 9 minutes 26 seconds  Estimated Blood Loss:  Estimated blood loss: none.      [facility]

## 2024-07-21 NOTE — Plan of Care (Signed)

## 2024-07-21 NOTE — TOC CM/SW Note (Signed)
 Transition of Care [facility]) - Inpatient Brief Assessment   Patient Details  Name: Peter Hartman MRN: [id number] Date of Birth: 10/29/1950  Transition of Care [facility]) CM/SW Contact:    Corean ONEIDA Haddock, RN Phone Number: 07/21/2024, 8:02 PM   Clinical Narrative:  Transition of Care Department [facility]) has reviewed patient and no TOC needs have been identified at this time.  If new patient transition needs arise, please place a TOC consult.   Transition of Care Asessment: Insurance and Status: Insurance coverage has been reviewed Patient has primary care physician: Yes     Prior/Current Home Services: No current home services Social Drivers of Health Review: SDOH reviewed no interventions necessary Readmission risk has been reviewed: Yes Transition of care needs: no transition of care needs at this time

## 2024-07-21 NOTE — Consult Note (Signed)
 "      Rogelia Copping, MD [facility]  [address], [address] [place], [place] [address] Phone: [phone] Fax : [phone]  Consultation  Referring Provider:     Dr. Cleatus Primary Care Physician:  Alla Amis, MD Primary Gastroenterologist: [place]         Reason for Consultation:     Rectal bleeding  Date of Admission:  07/20/2024 Date of Consultation:  07/21/2024         HPI:   Peter Hartman is a 74 y.o. male with history of a flutter on Eliquis .  The patient also has a history of diabetes and admitted with rectal bleeding.  The patient reports that he has been told many times to have a colonoscopy but has refused in the past.  The patient came in with rectal bleeding that he reported to have started yesterday.  He has never had rectal bleeding in the past.  His last bowel movement he says was in the middle of the night approximate 3:00 in the morning.  He did feel vasovagal and had a vasovagal episode earlier this month.  The patient denies any abdominal pain associate with rectal bleeding.  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  The patient's hemoglobin on admission was 12.3 that this morning went down to 11.6. Reversal of the patient's Eliquis  was ordered and a GI consult was called.  It was also the recommendation to continued following hemoglobin and hematocrit f and if continued bleeding then consider IR consultation.   Past Medical History:  Diagnosis Date   ETOH abuse    History of echocardiogram    a. 05/2020 Echo: EF 55-60%, nl RV fxn. Triv MR.   HLD (hyperlipidemia)    Hypertension    OSA (obstructive sleep apnea)    Paroxysmal atrial flutter (HCC)    a. Dx 05/2020-->converted w/ oral metoprolol . CHA2DS2VASc = 2-->eliquis .    Past Surgical History:  Procedure Laterality Date   HERNIA REPAIR      Prior to  Admission medications  Medication Sig Start Date End Date Taking? Authorizing Provider  apixaban  (ELIQUIS ) 5 MG TABS tablet Take 1 tablet by mouth twice daily 02/08/24  Yes End, Lonni, MD  fenofibrate  micronized (LOFIBRA) 200 MG capsule Take 200 mg by mouth daily. 05/29/18  Yes [provider]  ferrous sulfate 325 (65 FE) MG EC tablet Take 325 mg by mouth 3 (three) times daily with meals.   Yes [provider]  lisinopril  (ZESTRIL ) 10 MG tablet Take 10 mg by mouth daily. 02/16/20  Yes [provider]  Melatonin 10 MG CAPS Take 1 capsule by mouth daily as needed (sleep).   Yes [provider]  metoprolol  succinate (TOPROL -XL) 100 MG 24 hr tablet TAKE 1 & 1/2 (ONE & ONE-HALF) TABLETS BY MOUTH ONCE DAILY WITH OR IMMEDIATELY FOLLOWING A MEAL 09/21/23  Yes Dunn, Ryan M, PA-C  sildenafil  (VIAGRA ) 50 MG tablet Take 50 mg by mouth at bedtime. 01/20/20  Yes [provider]  traZODone (DESYREL) 50 MG tablet Take 100 mg by mouth at bedtime as needed. 11/14/22  Yes [provider]  AMBULATORY NON FORMULARY MEDICATION Trimix (30/1/10)-(Pap/Phent/PGE)  Test Dose  1ml vial   Qty #3 Refills 0  [facility] [phone] Fax [phone] Patient not taking: Reported on 05/08/2023 01/30/23   Stoioff, Scott C, MD  benzonatate (  TESSALON) 200 MG capsule Take 200 mg by mouth 3 (three) times daily as needed for cough. Patient not taking: Reported on 05/08/2023    [provider]    Family History  Problem Relation Age of Onset   Prostate cancer Neg Hx    Bladder Cancer Neg Hx    Kidney cancer Neg Hx      Social History[1]  Allergies as of 07/20/2024   (No Known Allergies)    Review of Systems:    All systems reviewed and negative except where noted in HPI.   Physical Exam:  Vital signs in last 24 hours: Temp:  [97.9 F (36.6 C)-98.3 F (36.8 C)] 98.2 F (36.8 C) (01/22 0333) Pulse Rate:  [69-109] 69 (01/22 0200) Resp:  [16-20]  18 (01/22 0200) BP: (120-184)/(60-101) 144/75 (01/22 0200) SpO2:  [93 %-100 %] 93 % (01/22 0200) Weight:  [120 kg-127.8 kg] 127.8 kg (01/21 2340)   General:   Pleasant, cooperative in NAD Head:  Normocephalic and atraumatic. Eyes:   No icterus.   Conjunctiva pink. PERRLA. Ears:  Normal auditory acuity. Neck:  Supple; no masses or thyroidomegaly Lungs: Respirations even and unlabored. Lungs clear to auscultation bilaterally.   No wheezes, crackles, or rhonchi.  Heart:  Regular rate and rhythm;  Without murmur, clicks, rubs or gallops Abdomen:  Soft, nondistended, nontender. Normal bowel sounds. No appreciable masses or hepatomegaly.  No rebound or guarding.  Rectal:  Not performed. Msk:  Symmetrical without gross deformities.    Extremities:  Without edema, cyanosis or clubbing. Neurologic:  Alert and oriented x3;  grossly normal neurologically. Skin:  Intact without significant lesions or rashes. Cervical Nodes:  No significant cervical adenopathy. Psych:  Alert and cooperative. Normal affect.  LAB RESULTS: Recent Labs    07/20/24 1825 07/20/24 2329 07/21/24 0345  WBC 9.0  --  8.3  HGB 12.3* 11.6* 11.6*  HCT 39.7 37.2* 37.3*  PLT 320  --  283   BMET Recent Labs    07/20/24 1825  NA 138  K 3.9  CL 102  CO2 25  GLUCOSE 142*  BUN 15  CREATININE 0.83  CALCIUM 8.9   LFT Recent Labs    07/20/24 1825  PROT 6.9  ALBUMIN 4.1  AST 19  ALT 14  ALKPHOS 67  BILITOT 0.2   PT/INR Recent Labs    07/20/24 1825  LABPROT 13.0  INR 0.9    STUDIES: CT ANGIO GI BLEED Result Date: 07/20/2024 EXAM: CTA ABDOMEN AND PELVIS WITH CONTRAST 07/20/2024 11:01:03 PM TECHNIQUE: CTA images of the abdomen and pelvis with intravenous contrast. Three-dimensional MIP/volume rendered formations were performed. Automated exposure control, iterative reconstruction, and/or weight based adjustment of the mA/kV was utilized to reduce the radiation dose to as low as reasonably achievable.  COMPARISON: None available. CLINICAL HISTORY: gi bleed FINDINGS: VASCULATURE: GI BLEED: There is a small amount of acute hemorrhage within the hepatic flexure of the colon. There is no inflammation or obvious mass at this level. AORTA: Moderate calcified atherosclerotic disease throughout the aorta. No abdominal aortic aneurysm. No dissection. 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 SUPERIOR MESENTERIC ARTERY: Mild stenosis at the origin of the superior mesenteric artery secondary to calcified atherosclerotic disease. INFERIOR MESENTERIC ARTERY: No acute finding. No occlusion or significant stenosis. RENAL ARTERIES: No acute finding. No occlusion or significant stenosis. ILIAC ARTERIES: Moderate calcified atherosclerotic disease throughout the bilateral iliac arteries. ABDOMEN/PELVIS: LOWER CHEST: Visualized portion of the  lower chest demonstrates no acute abnormality. LIVER: The liver is unremarkable. GALLBLADDER AND BILE DUCTS: Small gallstones are present. No biliary ductal dilatation. SPLEEN: The spleen is unremarkable. PANCREAS: The pancreas is unremarkable. ADRENAL GLANDS: Bilateral adrenal glands demonstrate no acute abnormality. KIDNEYS, URETERS AND BLADDER: A portion of the bladder is contained within a right inguinal hernia. No stones in the kidneys or ureters. No hydronephrosis. No perinephric or periureteral stranding. The urinary bladder is otherwise unremarkable. GI AND BOWEL: There is a small amount of acute hemorrhage within the hepatic flexure of the colon. There is no inflammation or obvious mass at this level. There is diffuse colonic diverticulosis. The appendix appears normal. Stomach and duodenal sweep demonstrate no acute abnormality. There is no bowel obstruction. No abnormal bowel wall thickening or distension. REPRODUCTIVE: Reproductive organs are unremarkable. PERITONEUM AND RETROPERITONEUM: There is a moderate  sized right inguinal hernia containing mesenteric fat and a portion of the bladder. There is a small fat containing umbilical hernia. No ascites or free air. LYMPH NODES: No lymphadenopathy. BONES AND SOFT TISSUES: Degenerative changes affect the spine and hips. No acute soft tissue abnormality. IMPRESSION: 1. Small amount of acute hemorrhage in the hepatic flexure of the colon, without associated inflammation or obvious mass. 2. 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 3. Moderate sized right inguinal hernia containing mesenteric fat and a portion of the bladder. 4. Cholelithiasis and diffuse colonic diverticulosis. Electronically signed by: Greig Pique MD 07/20/2024 11:11 PM EST RP Workstation: [id number]      Impression / Plan:   Assessment: Principal Problem:   Lower GI bleed Active Problems:   Essential hypertension   Paroxysmal atrial flutter (HCC)   Chronic anticoagulation   Obesity, Class III, BMI 40-49.9 (morbid obesity) (HCC)   OSA (obstructive sleep apnea)   Peter Hartman is a 74 y.o. y/o male with rectal bleeding that started yesterday.  The patient has no history of rectal bleeding in the past and has never had a colonoscopy because he has refused colonoscopies in the past.  The patient had a positive bleeding scan with bleeding in the hepatic flexure.  Plan:  The patient has had his Eliquis  reversed and will be set up for colonoscopy today.  The patient will be given a rapid prep and an order for prep has been written.  The patient has also been told the importance of finishing the prep but slowing down if he should get nauseated or should have any vomiting.  If the patient should have any significant GI bleeding or drop in hemoglobin while waiting to proceed with a colonoscopy then interventional radiology may need to be consulted.  Otherwise we   have encouraged the patient to purge as efficiently as he can so that a colonoscopy can be done today.  The patient has been explained the plan and agrees with it.  Thank you for involving me in the care of this patient.      LOS: 0 days   Rogelia Copping, MD, MD. NOLIA 07/21/2024, 7:28 AM,  Pager [phone] 7am-5pm  Check AMION for 5pm -7am coverage and on weekends   Note: This dictation was prepared with Dragon dictation along with smaller phrase technology. Any transcriptional errors that result from this process are unintentional.       [1]  Social History Tobacco Use   Smoking status: Former    Current packs/day: 0.00  Types: Cigarettes    Quit date: 04/26/2016    Years since quitting: 8.2   Smokeless tobacco: Never  Vaping Use   Vaping status: Never Used  Substance Use Topics   Alcohol use: Yes   Drug use: Never   "

## 2024-07-21 NOTE — Assessment & Plan Note (Addendum)
 Chronic anticoagulation Patient hemodynamically stable CT angio with small acute hemorrhage hepatic flexure Small downtrend in hemoglobin 12.3-11.6 (was 12.7 06/29/2024) [facility]  reversal ordered from the ED Hold Eliquis  Continue serial H&H--if ongoing bleeding, will need IR consult for consideration of embolization Will keep n.p.o. in case of procedure GI consult

## 2024-07-21 NOTE — ED Notes (Signed)
 Patient resting in bed free from sign of distress. Pt sleeping but wakes easily to verbal stimuli and follows commands. Breathing unlabored speaking in full sentences with symmetric chest rise and fall. Bed low and locked with side rails raised x2. Call bell in reach and monitor in place. Pt watching TV. Denies further needs at this time.

## 2024-07-21 NOTE — ED Notes (Signed)
 Patient has finished prep

## 2024-07-21 NOTE — ED Notes (Signed)
 Patient ambulated to and from the restroom independently and with a steady gait. No other needs voiced at this time.

## 2024-07-21 NOTE — Assessment & Plan Note (Signed)
 Complicating factor to overall prognosis and care

## 2024-07-22 ENCOUNTER — Telehealth: Payer: Self-pay

## 2024-07-22 LAB — CBC
HCT: 38.2 % — ABNORMAL LOW (ref 39.0–52.0)
Hemoglobin: 11.9 g/dL — ABNORMAL LOW (ref 13.0–17.0)
MCH: 24.2 pg — ABNORMAL LOW (ref 26.0–34.0)
MCHC: 31.2 g/dL (ref 30.0–36.0)
MCV: 77.6 fL — ABNORMAL LOW (ref 80.0–100.0)
Platelets: 302 K/uL (ref 150–400)
RBC: 4.92 MIL/uL (ref 4.22–5.81)
RDW: 16.7 % — ABNORMAL HIGH (ref 11.5–15.5)
WBC: 7.9 K/uL (ref 4.0–10.5)
nRBC: 0 % (ref 0.0–0.2)

## 2024-07-22 MED ORDER — FERROUS SULFATE 325 (65 FE) MG PO TBEC: 325.0000 mg | DELAYED_RELEASE_TABLET | Freq: Every day | ORAL | Status: AC

## 2024-07-22 NOTE — Telephone Encounter (Signed)
 Patient's wife called stating that he had a colonoscopy done yesterday with Dr. Jinny and that they were told to reschedule it because he was not able to remove polyps. Therefore, they requested to schedule it in 2 weeks, but Dr. Jinny does not have anything until the end of April. I told her that I would check on it first and then I would give her a call back. She understood. I then hollace Hollering to ask if she knew anything about it before I scheduled it. Waiting on a response.

## 2024-07-22 NOTE — Discharge Summary (Signed)
 " Physician Discharge Summary   Patient: Peter Hartman MRN: [id number] DOB: [birth year]  Admit date:     07/20/2024  Discharge date: 07/22/24  Discharge Physician: Murvin Mana   PCP: Alla Amis, MD   Recommendations at discharge:   Follow-up with PCP in 1 week. Follow-up with cardiologist as previous scheduled. Follow-up with GI in 2 weeks.  Discharge Diagnoses: Principal Problem:   Lower GI bleed Active Problems:   Chronic anticoagulation   Paroxysmal atrial flutter (HCC)   Essential hypertension   Obesity, Class III, BMI 40-49.9 (morbid obesity) (HCC)   OSA (obstructive sleep apnea)   Rectal bleeding  Resolved Problems:   * No resolved hospital problems. *  Hospital Course: Peter Hartman is a 74 y.o. male with medical history significant for Atrial flutter on Eliquis , OSA, , class III obesity, being admitted with rectal bleeding.  CT angiogram showed a small amount of bleeding in the hepatic flexure of the colon.  Seen by GI, colonoscopy showed multiple polyps and diffuse diverticulosis.  No additional bleeding. Patient was monitored overnight after colonoscopy, hemoglobin still stable.  No additional bleeding.  Medically stable for discharge.  Will hold off anticoagulation until seen by GI again.  Assessment and Plan:  Lower GI bleed probably from diverticulosis. Multiple colon polyps. Chronic anticoagulation Had a positive colonoscopy, no additional bleeding.  Will hold off anticoagulation.  Medically stable for discharge.   Paroxysmal atrial flutter (HCC) Discontinued Eliquis , continue other blood pressure medicines and beta-blocker.   Essential hypertension Resume her blood pressure medicines.   OSA (obstructive sleep apnea) Not on CPAP   Obesity, Class III, BMI 40-49.9 (morbid obesity) (HCC) Complicating factor to overall prognosis and care        Consultants: GI. Procedures performed: Colonoscopy. Disposition: Home Diet recommendation:   Discharge Diet Orders (From admission, onward)     Start     Ordered   07/22/24 0000  Diet - low sodium heart healthy        07/22/24 0847           Cardiac diet DISCHARGE MEDICATION: Allergies as of 07/22/2024   No Known Allergies      Medication List     STOP taking these medications    AMBULATORY NON FORMULARY MEDICATION   benzonatate 200 MG capsule Commonly known as: TESSALON   Eliquis  5 MG Tabs tablet Generic drug: apixaban        TAKE these medications    fenofibrate  micronized 200 MG capsule Commonly known as: LOFIBRA Take 200 mg by mouth daily.   ferrous sulfate  325 (65 FE) MG EC tablet Take 1 tablet (325 mg total) by mouth daily with breakfast. What changed: when to take this   lisinopril  10 MG tablet Commonly known as: ZESTRIL  Take 10 mg by mouth daily.   Melatonin 10 MG Caps Take 1 capsule by mouth daily as needed (sleep).   metoprolol  succinate 100 MG 24 hr tablet Commonly known as: TOPROL -XL TAKE 1 & 1/2 (ONE & ONE-HALF) TABLETS BY MOUTH ONCE DAILY WITH OR IMMEDIATELY FOLLOWING A MEAL   sildenafil  50 MG tablet Commonly known as: VIAGRA  Take 50 mg by mouth at bedtime.   traZODone 50 MG tablet Commonly known as: DESYREL Take 100 mg by mouth at bedtime as needed.        Follow-up Information     Alla Amis, MD Follow up in 1 week(s).   Specialty: Family Medicine Contact information: [address] [facility] [place] [address]  [phone]         Therisa Bi, MD Follow up in 2 week(s).   Specialty: Gastroenterology Contact information: [address] [place] [address] [phone]                Discharge Exam: Peter Hartman   07/20/24 1824 07/20/24 2340  Weight: 120 kg 127.8 kg   General exam: Appears calm and comfortable, morbidly obese. Respiratory system: Clear to auscultation. Respiratory effort normal. Cardiovascular system: S1 & S2 heard, RRR. No JVD, murmurs,  rubs, gallops or clicks. No pedal edema. Gastrointestinal system: Abdomen is nondistended, soft and nontender. No organomegaly or masses felt. Normal bowel sounds heard. Central nervous system: Alert and oriented. No focal neurological deficits. Extremities: Symmetric 5 x 5 power. Skin: No rashes, lesions or ulcers Psychiatry: Judgement and insight appear normal. Mood & affect appropriate.    Condition at discharge: good  The results of significant diagnostics from this hospitalization (including imaging, microbiology, ancillary and laboratory) are listed below for reference.   Imaging Studies: CT ANGIO GI BLEED Result Date: 07/20/2024 EXAM: CTA ABDOMEN AND PELVIS WITH CONTRAST 07/20/2024 11:01:03 PM TECHNIQUE: CTA images of the abdomen and pelvis with intravenous contrast. Three-dimensional MIP/volume rendered formations were performed. Automated exposure control, iterative reconstruction, and/or weight based adjustment of the mA/kV was utilized to reduce the radiation dose to as low as reasonably achievable. COMPARISON: None available. CLINICAL HISTORY: gi bleed FINDINGS: VASCULATURE: GI BLEED: There is a small amount of acute hemorrhage within the hepatic flexure of the colon. There is no inflammation or obvious mass at this level. AORTA: Moderate calcified atherosclerotic disease throughout the aorta. No abdominal aortic aneurysm. No dissection. 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 SUPERIOR MESENTERIC ARTERY: Mild stenosis at the origin of the superior mesenteric artery secondary to calcified atherosclerotic disease. INFERIOR MESENTERIC ARTERY: No acute finding. No occlusion or significant stenosis. RENAL ARTERIES: No acute finding. No occlusion or significant stenosis. ILIAC ARTERIES: Moderate calcified atherosclerotic disease throughout the bilateral iliac arteries. ABDOMEN/PELVIS: LOWER CHEST: Visualized portion of the  lower chest demonstrates no acute abnormality. LIVER: The liver is unremarkable. GALLBLADDER AND BILE DUCTS: Small gallstones are present. No biliary ductal dilatation. SPLEEN: The spleen is unremarkable. PANCREAS: The pancreas is unremarkable. ADRENAL GLANDS: Bilateral adrenal glands demonstrate no acute abnormality. KIDNEYS, URETERS AND BLADDER: A portion of the bladder is contained within a right inguinal hernia. No stones in the kidneys or ureters. No hydronephrosis. No perinephric or periureteral stranding. The urinary bladder is otherwise unremarkable. GI AND BOWEL: There is a small amount of acute hemorrhage within the hepatic flexure of the colon. There is no inflammation or obvious mass at this level. There is diffuse colonic diverticulosis. The appendix appears normal. Stomach and duodenal sweep demonstrate no acute abnormality. There is no bowel obstruction. No abnormal bowel wall thickening or distension. REPRODUCTIVE: Reproductive organs are unremarkable. PERITONEUM AND RETROPERITONEUM: There is a moderate sized right inguinal hernia containing mesenteric fat and a portion of the bladder. There is a small fat containing umbilical hernia. No ascites or free air. LYMPH NODES: No lymphadenopathy. BONES AND SOFT TISSUES: Degenerative changes affect the spine and hips. No acute soft tissue abnormality. IMPRESSION: 1. Small amount of acute hemorrhage in the hepatic flexure of the colon, without associated inflammation or obvious mass. 2. 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 3. Moderate sized right inguinal hernia containing mesenteric fat and a portion of the bladder. 4. Cholelithiasis and diffuse colonic diverticulosis. Electronically signed by: Greig Pique MD 07/20/2024 11:11 PM EST RP Workstation: [id number]    Microbiology: Results for orders placed or performed  during the hospital encounter of 06/05/20  Resp Panel by RT-PCR (Flu A&B, Covid) Nasopharyngeal Swab     Status: None   Collection Time: 06/05/20 10:54 AM   Specimen: Nasopharyngeal Swab; Nasopharyngeal(NP) swabs in vial transport medium  Result Value Ref Range Status   SARS Coronavirus 2 by RT PCR NEGATIVE NEGATIVE Final    Comment: (NOTE) SARS-CoV-2 target nucleic acids are NOT DETECTED.  The SARS-CoV-2 RNA is generally detectable in upper respiratory specimens during the acute phase of infection. The lowest concentration of SARS-CoV-2 viral copies this assay can detect is 138 copies/mL. A negative result does not preclude SARS-Cov-2 infection and should not be used as the sole basis for treatment or other patient management decisions. A negative result may occur with  improper specimen collection/handling, submission of specimen other than nasopharyngeal swab, presence of viral mutation(s) within the areas targeted by this assay, and inadequate number of viral copies(<138 copies/mL). A negative result must be combined with clinical observations, patient history, and epidemiological information. The expected result is Negative.  Fact Sheet for Patients:  bloggercourse.com  Fact Sheet for Healthcare Providers:  seriousbroker.it  This test is no t yet approved or cleared by the [place]  FDA and  has been authorized for detection and/or diagnosis of SARS-CoV-2 by FDA under an Emergency Use Authorization (EUA). This EUA will remain  in effect (meaning this test can be used) for the duration of the COVID-19 declaration under Section 564(b)(1) of the Act, 21 U.S.C.section 360bbb-3(b)(1), unless the authorization is terminated  or revoked sooner.       Influenza A by PCR NEGATIVE NEGATIVE Final   Influenza B by PCR NEGATIVE NEGATIVE Final    Comment: (NOTE) The Xpert Xpress SARS-CoV-2/FLU/RSV plus assay is intended as an  aid in the diagnosis of influenza from Nasopharyngeal swab specimens and should not be used as a sole basis for treatment. Nasal washings and aspirates are unacceptable for Xpert Xpress SARS-CoV-2/FLU/RSV testing.  Fact Sheet for Patients: bloggercourse.com  Fact Sheet for Healthcare Providers: seriousbroker.it  This test is not yet approved or cleared by the [place]  FDA and has been authorized for detection and/or diagnosis of SARS-CoV-2 by FDA under an Emergency Use Authorization (EUA). This EUA will remain in effect (meaning this test can be used) for the duration of the COVID-19 declaration under Section 564(b)(1) of the Act, 21 U.S.C. section 360bbb-3(b)(1), unless the authorization is terminated or revoked.  Performed at [facility], [address] [place], [place] [address]   Culture, blood (Routine X 2) w Reflex to ID Panel     Status: None   Collection Time: 06/05/20  5:51 PM   Specimen: BLOOD  Result Value Ref Range Status   Specimen Description BLOOD BLOOD LEFT FOREARM  Final   Special Requests   Final    BOTTLES DRAWN AEROBIC AND ANAEROBIC Blood Culture adequate volume   Culture   Final    NO GROWTH 5 DAYS Performed at [facility], [address], [place], [place] [address]    Report Status 06/10/2020 FINAL  Final  Culture, blood (Routine X 2) w Reflex to ID Panel     Status: None   Collection Time: 06/05/20  5:51 PM  Specimen: BLOOD  Result Value Ref Range Status   Specimen Description BLOOD RIGHT ANTECUBITAL  Final   Special Requests   Final    BOTTLES DRAWN AEROBIC AND ANAEROBIC Blood Culture adequate volume   Culture   Final    NO GROWTH 5 DAYS Performed at [facility], [address] [place], [place] [address]    Report Status 06/10/2020 FINAL  Final    Labs: CBC: Recent Labs  Lab 07/20/24 1825 07/20/24 2329 07/21/24 0345 07/21/24 1848 07/22/24 0317   WBC 9.0  --  8.3 9.1 7.9  HGB 12.3* 11.6* 11.6* 12.1* 11.9*  HCT 39.7 37.2* 37.3* 38.9* 38.2*  MCV 76.5*  --  76.7* 76.7* 77.6*  PLT 320  --  283 319 302   Basic Metabolic Panel: Recent Labs  Lab 07/20/24 1825  NA 138  K 3.9  CL 102  CO2 25  GLUCOSE 142*  BUN 15  CREATININE 0.83  CALCIUM 8.9   Liver Function Tests: Recent Labs  Lab 07/20/24 1825  AST 19  ALT 14  ALKPHOS 67  BILITOT 0.2  PROT 6.9  ALBUMIN 4.1   CBG: No results for input(s): GLUCAP in the last 168 hours.  Discharge time spent: 35 minutes.  Signed: Murvin Mana, MD [facility] 07/22/2024 "

## 2024-07-22 NOTE — TOC Transition Note (Signed)
 Transition of Care [facility]) - Discharge Note   Patient Details  Name: Peter Hartman MRN: [id number] Date of Birth: [birth year]  [facility] CM/SW Contact:  Alfonso Rummer, LCSW Phone Number: 07/22/2024, 10:54 AM   Clinical Narrative:    Pt will discharge home with self care. Pt will transport home with family. Family at bedside and aware of patient discharging home.    Final next level of care: Home/Self Care     Patient Goals and CMS Choice            Discharge Placement                  Name of family member notified: family at bedside Patient and family notified of of transfer: 07/22/24  Discharge Plan and Services Additional resources added to the After Visit Summary for                                       Social Drivers of Health (SDOH) Interventions SDOH Screenings   Food Insecurity: Patient Declined (07/21/2024)  Housing: Unknown (07/21/2024)  Transportation Needs: No Transportation Needs (07/21/2024)  Utilities: Not At Risk (07/21/2024)  Financial Resource Strain: Low Risk  (09/11/2023)   Received from [facility]  Social Connections: Socially Integrated (07/21/2024)  Tobacco Use: Medium Risk (07/20/2024)     Readmission Risk Interventions     No data to display

## 2024-07-22 NOTE — Plan of Care (Signed)

## 2024-07-26 NOTE — Telephone Encounter (Signed)
 Hello, I was just checking up on scheduling another colonoscopy since I sent you a message last week and I don't see any scheduled. You had stated that you would be calling him. Please let me know if you need help.

## 2024-07-28 ENCOUNTER — Other Ambulatory Visit: Payer: Self-pay

## 2024-07-28 NOTE — Telephone Encounter (Signed)
 Called Mrs. Ninh and left her a voicemail to call me back so I could schedule her husband a colonoscopy with Dr. Jinny.

## 2024-07-28 NOTE — Telephone Encounter (Signed)
 Mrs. Hardgrove called back and I was able to schedule her husband a colonoscopy for next week on 08/03/2024 at [facility] with Dr. Jinny. I let her know that I would be sending the instructions via MyChart since she stated that she is active and knows how to look for things. Prescription was sent to her pharmacy [facility] [address]

## 2024-08-02 ENCOUNTER — Other Ambulatory Visit: Payer: Self-pay

## 2024-08-02 DIAGNOSIS — K921 Melena: Secondary | ICD-10-CM

## 2024-08-02 DIAGNOSIS — Z860100 Personal history of colon polyps, unspecified: Secondary | ICD-10-CM

## 2024-08-02 MED ORDER — NA SULFATE-K SULFATE-MG SULF 17.5-3.13-1.6 GM/177ML PO SOLN: 354.0000 mL | Freq: Once | ORAL | 0 refills | Status: AC

## 2024-08-02 NOTE — Addendum Note (Signed)
 Addended by: JODIE HEADINGS on: 08/02/2024 04:43 PM   Modules accepted: Orders

## 2024-08-04 ENCOUNTER — Telehealth: Payer: Self-pay | Admitting: Internal Medicine

## 2024-08-04 NOTE — Addendum Note (Signed)
 Addendum  created 08/04/24 0836 by Vicci Camellia Glatter, MD   Attestation recorded in Intraprocedure, Intraprocedure Attestations filed

## 2024-08-04 NOTE — Telephone Encounter (Signed)
"  ° °  Pre-operative Risk Assessment    Patient Name: Peter Hartman  DOB: [birth year] MRN: [id number]   Date of last office visit: 05/08/23 Date of next office visit: n/a   Request for Surgical Clearance    Procedure:  colonoscopy   Date of Surgery:  Clearance 09/01/24                                Surgeon:  Dr Rogelia Copping Surgeon's Group or Practice Name:  [facility] Phone number:  [phone] Fax number:  [phone]   Type of Clearance Requested:   - Pharmacy:  Hold Apixaban  (Eliquis ) instructions   Type of Anesthesia:  Not Indicated   Additional requests/questions:    Peter Hartman   08/04/2024, 10:11 AM   "

## 2024-09-01 ENCOUNTER — Ambulatory Visit: Admit: 2024-09-01 | Admitting: Gastroenterology
